# Patient Record
Sex: Female | Born: 1970 | Race: White | Hispanic: No | Marital: Married | Smoking: Never smoker
Health system: Southern US, Community
[De-identification: ages and names within clinical notes are randomized; demographics above are authoritative.]

## PROBLEM LIST (undated history)

## (undated) DIAGNOSIS — K219 Gastro-esophageal reflux disease without esophagitis: Secondary | ICD-10-CM

## (undated) HISTORY — DX: Gastro-esophageal reflux disease without esophagitis: K21.9

## (undated) HISTORY — PX: OTHER SURGICAL HISTORY: SHX169

---

## 2008-02-28 HISTORY — PX: HERNIA REPAIR: SHX51

## 2009-02-28 ENCOUNTER — Encounter: Payer: Self-pay | Admitting: Obstetrics & Gynecology

## 2009-02-28 ENCOUNTER — Ambulatory Visit: Payer: Self-pay | Admitting: Obstetrics & Gynecology

## 2009-09-29 HISTORY — PX: ENDOVENOUS ABLATION SAPHENOUS VEIN W/ LASER: SUR449

## 2010-01-07 ENCOUNTER — Ambulatory Visit: Payer: Self-pay | Admitting: Family Medicine

## 2010-01-07 DIAGNOSIS — K219 Gastro-esophageal reflux disease without esophagitis: Secondary | ICD-10-CM

## 2010-01-09 ENCOUNTER — Telehealth (INDEPENDENT_AMBULATORY_CARE_PROVIDER_SITE_OTHER): Payer: Self-pay | Admitting: *Deleted

## 2010-05-15 ENCOUNTER — Ambulatory Visit: Payer: Self-pay | Admitting: Obstetrics & Gynecology

## 2010-10-29 NOTE — Assessment & Plan Note (Signed)
Summary: NOV: GERD   Vital Signs:  Patient profile:   40 year old female Height:      67.2 inches Weight:      140 pounds BMI:     21.88 O2 Sat:      99 % on Room air Temp:     98.8 degrees F oral Pulse rate:   86 / minute BP sitting:   115 / 70  (left arm) Cuff size:   regular  Vitals Entered By: Kathlene November (January 07, 2010 1:28 PM)  O2 Flow:  Room air CC: NP_ Friday night sour stomach then burping then woke up with a cough the next day Is Patient Diabetic? No   CC:  NP_ Friday night sour stomach then burping then woke up with a cough the next day.  History of Present Illness: NP_ Friday night sour stomach then burping then woke up with a cough the next day.   Started IBU 600mg  for several weeks for deep vein injections for slcerotherapy. Then on March 18th started vitamin C and bioflavenoids for easy bruising.Then had left leg worked on in March and restarted the IBU.  Then Friday night felt like stomach was "sour" and had a mild ST.  Went downstiars adn drank a soda and started to b urp.  Then next AM when woke up had a severe pain in her uuper chest, throat and up towards her ears.  Then by Sat night the heaviness in her chest was severe and if bend over felt fluid come up into the back of her throat adn nose.  Next day didn't eat much.  Then yesterday started with a cough.  No URI or allergy sxs.  Chest feels very heavy today. NO diarrhea.  Tried Maalox yesterday.  Also tried husbands Zanctac.  Maalox did help. Nofever.   Habits & Providers  Alcohol-Tobacco-Diet     Alcohol drinks/day: <1     Tobacco Status: never  Exercise-Depression-Behavior     Does Patient Exercise: yes     Type of exercise: ellyptical, treadmill, running, weights     Exercise (avg: min/session): 30-60     Times/week: 3     Have you felt down or hopeless? no     STD Risk: never     Drug Use: no     Seat Belt Use: always  Current Medications (verified): 1)  Acai 500 Mg Caps (Acai) .... Take One  Tablet By Mouth Once Ad Ay 2)  Vitamin C 1000 Mg Tabs (Ascorbic Acid) .... Take One Tablet By Mouth Once A Day 3)  Ibuprofen 600 Mg Tabs (Ibuprofen) .... Take As Needed As Needed For Pain  Allergies (verified): 1)  ! Pcn  Comments:  Nurse/Medical Assistant: The patient's medications and allergies were reviewed with the patient and were updated in the Medication and Allergy Lists. Kathlene November (January 07, 2010 1:32 PM)  Past History:  Past Surgical History: Right leg endovenous laser ablation `09/2009 Left leg ELA 11/2009 Mass removed from right side, umbilical hernia repair 02/2008  Family History: Father and mother with DM, HTN  Social History: Preschool Asst Runner, broadcasting/film/video at [employer]  BS in Accounting from [employer]. Married to [place] with 2 kids, Romeo Apple and [employer] and her Engineer, agricultural.  Never Smoked Alcohol use-yes Drug use-no Regular exercise-yes 3 caffeinated drinks per day.  Smoking Status:  never Does Patient Exercise:  yes STD Risk:  never Drug Use:  no Seat Belt Use:  always  Review of Systems  No fever/sweats/weakness, unexplained weight loss/gain.  No vison changes.  No difficulty hearing/ringing in ears, hay fever/allergies.  No chest pain/discomfort, palpitations.  No Br lump/nipple discharge.  + cough/wheeze.  No blood in BM, nausea/vomiting/diarrhea.  No nighttime urination, leaking urine, unusual vaginal bleeding, discharge (penis or vagina).  No muscle/joint pain. No rash, change in mole.  No HA, memory loss.  No anxiety, sleep d/o, depression.  No easy bruising/bleeding, unexplained lump   Physical Exam  General:  Well-developed,well-nourished,in no acute distress; alert,appropriate and cooperative throughout examination Head:  Normocephalic and atraumatic without obvious abnormalities. No apparent alopecia or balding. Eyes:  No corneal or conjunctival inflammation noted. EOMI. Perrla. Ears:  External ear exam shows no significant lesions or deformities.   Otoscopic examination reveals clear canals, tympanic membranes are intact bilaterally without bulging, retraction, inflammation or discharge. Hearing is grossly normal bilaterally. Mouth:  Oral mucosa and oropharynx without lesions or exudates.  Teeth in good repair. Neck:  No deformities, masses, or tenderness noted. Lungs:  Normal respiratory effort, chest expands symmetrically. Lungs are clear to auscultation, no crackles or wheezes. Heart:  Normal rate and regular rhythm. S1 and S2 normal without gallop, murmur, click, rub or other extra sounds. Skin:  no rashes.   Cervical Nodes:  No lymphadenopathy noted Psych:  Cognition and judgment appear intact. Alert and cooperative with normal attention span and concentration. No apparent delusions, illusions, hallucinations   Impression & Recommendations:  Problem # 1:  GERD (ICD-530.81) Discussed options. For now recommend starting a PPI in addition to avoid measures. Discussed reflux measures. Samples of Protonix 40mg  once a day for 10 days. Then can switch to formularly covered PPI or Prilosec or prevacid both of which are over the countere.  Please let me know if not better in 5 days or if getting worrse or SOB. Chest pain is from the GERD and does not sounds cardiac. Also recommend swithc to Tyelnol for pain releif with her legs instead of using NSAIDs and reviewed all the OTC NSAIDs to avoid.  Also need to avoid acidic juices and the peppermint altoids she has been eating.    Complete Medication List: 1)  Acai 500 Mg Caps (Acai) .... Take one tablet by mouth once ad ay 2)  Vitamin C 1000 Mg Tabs (Ascorbic acid) .... Take one tablet by mouth once a day 3)  Ibuprofen 600 Mg Tabs (Ibuprofen) .... Take as needed as needed for pain  Patient Instructions: 1)  Call if not better in 5 days.   2)  If samples are helping then when run out you can call me and we call something into your drug store.  Or can try prilosec or prevacid , both of which are  over the counter. 3)  Call if gets shortness of breath or fever.  4)  Avoid anything greasy, spicey, acidic, or caffeine.   PAP Result Date:  02/27/2009 PAP Result:  normal

## 2010-10-29 NOTE — Progress Notes (Signed)
Summary: ? SE from Protonix  Phone Note Call from Patient   Caller: Patient Summary of Call: Pt states since she started Protonix her feet have been very itchy. Pt wanted to know if this was an allergy to the med?  Please advise. Initial call taken by: Payton Spark CMA,  January 09, 2010 2:39 PM  Follow-up for Phone Call        unlikely due to the medicine as an allergy would cause diffuse itching.  Follow-up by: Seymour Bars DO,  January 09, 2010 2:48 PM  Additional Follow-up for Phone Call Additional follow up Details #1::        Pt aware Additional Follow-up by: Payton Spark CMA,  January 09, 2010 3:01 PM

## 2010-11-06 ENCOUNTER — Telehealth: Payer: Self-pay | Admitting: Family Medicine

## 2010-11-14 NOTE — Progress Notes (Signed)
Summary: Rx for cold sore  Phone Note Call from Patient   Caller: Patient Summary of Call: Pt states she tried to get an apt for today but was unable. She needs Rx for cold sore. She has used pills and cream in the past. Please advise.  Initial call taken by: Payton Spark CMA,  November 06, 2010 3:43 PM    New/Updated Medications: VALACYCLOVIR HCL 1 GM TABS (VALACYCLOVIR HCL) 2 tabs by mouth two times a day x  1 day Prescriptions: VALACYCLOVIR HCL 1 GM TABS (VALACYCLOVIR HCL) 2 tabs by mouth two times a day x  1 day  #4 tabs x 1   Entered and Authorized by:   Seymour Bars DO   Signed by:   Seymour Bars DO on 11/06/2010   Method used:   [facility] [phone]* (retail)       [address]       [place], [place]  [address]       Ph: [phone] or [phone]       Fax: [phone]   RxID:   [id number]   Appended Document: Rx for cold sore LMOM informing Pt of the above

## 2010-11-29 ENCOUNTER — Ambulatory Visit (INDEPENDENT_AMBULATORY_CARE_PROVIDER_SITE_OTHER): Payer: BC Managed Care – PPO | Admitting: Family Medicine

## 2010-11-29 ENCOUNTER — Encounter: Payer: Self-pay | Admitting: Family Medicine

## 2010-11-29 DIAGNOSIS — B009 Herpesviral infection, unspecified: Secondary | ICD-10-CM

## 2010-11-29 DIAGNOSIS — L708 Other acne: Secondary | ICD-10-CM

## 2010-12-02 ENCOUNTER — Telehealth: Payer: Self-pay | Admitting: Family Medicine

## 2010-12-05 NOTE — Assessment & Plan Note (Signed)
Summary: Cold sore, acne on chin   Vital Signs:  Patient profile:   40 year old female Height:      67.2 inches (170.69 cm) Weight:      141.50 pounds (64.32 kg) BMI:     22.11 O2 Sat:      99 % on Room air Temp:     98.3 degrees F (36.83 degrees C) oral Pulse rate:   83 / minute BP sitting:   128 / 75  (right arm) Cuff size:   regular  Vitals Entered By: Lucious Groves CMA (November 29, 2010 2:49 PM)  O2 Flow:  Room air CC: Cold sore on bottom lip that began today./kb Comments Patient notes that she finished the Valacyclovir previously given.   CC:  Cold sore on bottom lip that began today./kb.  History of Present Illness: Cold sore on bottom lip that began today./kb Used to take valtrex but it was expensive. Also use to use zovirax cream but it expired in 2007.    Also having acne only on her chin for a few months Has beem working wiht an esthetician but not really improved. Starte proacitive as well.   Current Medications (verified): 1)  Valacyclovir Hcl 1 Gm Tabs (Valacyclovir Hcl) .... 2 Tabs By Mouth Two Times A Day X  1 Day  Allergies (verified): 1)  ! Pcn  Physical Exam  Skin:  Large fever blister in he center lower lip. Also has papular acne on her chin only.    Impression & Recommendations:  Problem # 1:  COLD SORE (ICD-054.9) Discussed tx will try a chearp regimen.   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 discusse avoiding touching her chin or rubbing it.  Will start clinda toipical cream and see if improved over the next few weeks. If not coniser course of oral steroids.   Complete Medication List: 1)  Famciclovir 500 Mg Tabs (Famciclovir) .... 3 tab by mouth x 1 2)  Clindamycin Phosphate 2 % Crea (Clindamycin phosphate) .... Apply at bedtime. 3)  Zovirax 5 % Oint (Acyclovir) .... Apply 6 x a day for 7 days  Patient Instructions: 1)  Call if skin on chin is not better in 2 week.  Prescriptions: ZOVIRAX 5 % OINT (ACYCLOVIR) Apply 6 x a  day for 7 days  #1 tube x 2   Entered and Authorized by:   Nani Gasser MD   Signed by:   Nani Gasser MD on 11/29/2010   Method used:   [facility]        [facility] [phone][facility] (retail)       [address]       [place], [place]  [address]       Ph: [phone] or [phone]       Fax: [phone]   RxID:   [id number] CLINDAMYCIN PHOSPHATE 2 % CREA (CLINDAMYCIN PHOSPHATE) Apply at bedtime.  #1 tube x 0   Entered and Authorized by:   Nani Gasser MD   Signed by:   Nani Gasser MD on 11/29/2010   Method used:   [facility]        [facility] [phone]* (retail)       [address]       [place], [place]  [address]       Ph: [phone] or [phone]       Fax: [phone]   RxID:   [id number] FAMCICLOVIR 500 MG  TABS (FAMCICLOVIR) 3 tab by mouth x 1  #9 x 2   Entered and Authorized by:   Nani Gasser MD   Signed by:   Nani Gasser MD on 11/29/2010   Method used:   [facility]        [facility] [phone]* (retail)       [address]       [place], [place]  [address]       Ph: [phone] or [phone]       Fax: [phone]   RxID:   [id number]    Orders Added: 1)  Est. Patient Level III [[address]]

## 2010-12-10 NOTE — Progress Notes (Signed)
Summary: Clindamycin Rx  Phone Note From Pharmacy Call back at [phone]   [facility]  [address] [phone]* Summary of Call: The 2% Clindamycin is a vaginal cream, pt needs to 1% if it is to be used as a facial cream, pls update Rx  Initial call taken by: Lannette Donath,  December 02, 2010 3:54 PM  Follow-up for Phone Call        Sorry, new rx sent.  Follow-up by: Nani Gasser MD,  December 02, 2010 4:44 PM    New/Updated Medications: CLINDAMYCIN PHOSPHATE 1 % LOTN (CLINDAMYCIN PHOSPHATE) Apply to affected area once a day. Prescriptions: CLINDAMYCIN PHOSPHATE 1 % LOTN (CLINDAMYCIN PHOSPHATE) Apply to affected area once a day.  #1 tube x 0   Entered and Authorized by:   Nani Gasser MD   Signed by:   Nani Gasser MD on 12/02/2010   Method used:   [facility]ronically to        [facility] [phone]* (retail)       [address]       [place], [place]  [address]       Ph: [phone] or [phone]       Fax: [phone]   RxID:   [id number]

## 2011-02-11 NOTE — Assessment & Plan Note (Signed)
NAMEASA, FATH                   ACCOUNT NO.:  [id number]   MEDICAL RECORD NO.:  [id number]          PATIENT TYPE:  POB   LOCATION:  [facility] at [place]         FACILITY:  [facility]   PHYSICIAN:  Allie Bossier, MD        DATE OF BIRTH:  1971-02-28   DATE OF SERVICE:  02/28/2009                                  CLINIC NOTE   Samone is a 40 year old married white, gravida 2, para 2.  She has 60 and  60 year old sons.  She comes here as a new patient exam.  She has no  particular GYN complaints.  She just needs an annual exam.   PAST MEDICAL HISTORY:  None.   PAST SURGICAL HISTORY:  She had umbilical hernia repair.  She had a  fibroma removal from her left abdominal wall.   REVIEW OF SYSTEMS:  She is married.  Her husband has had a vasectomy.  She works at a preschool.  Her last Pap smear was in 2008 and was  normal, and she did have a mammogram in 2005.   MEDICATIONS:  None.   ALLERGIES:  PENICILLIN which causes hives.  No latex allergies.   FAMILY HISTORY:  Negative for breast, GYN, and colon malignancies.   SOCIAL HISTORY:  She drinks socially.  Denies tobacco, alcohol, or drug  use.   Adding to her first chief complaint is even though she does not have any  GYN complaints, she does say that for the last 3-4 months, she has felt  a mass in her left breast that is nonpainful.  She denies nipple  discharges or skin changes.  She did have a normal mammogram in 2005  when she though she felt something else.   PHYSICAL EXAMINATION:  GENERAL:  A well-nourished, well-hydrated, very  pleasant white female.  VITAL SIGNS:  Height 6 feet 7.5, weight 141, blood pressure 112/74, and  pulse 70.  HEENT:  Normal.  HEART:  Regular rate and rhythm.  LUNGS:  Clear to auscultation bilaterally.  BREASTS:  To my feeling normal bilaterally.  She does have some  fibrocystic changes bilaterally in the upper outer quadrants on both  sides; however, as I have explained to her I will order diagnostic  mammogram of her left breast where she feels that it is different  tissue.  We will order routine mammogram on the right.  ABDOMEN:  Benign.  EXTERNAL GENITALIA:  Normal.  No lesions.  Cervix normal.  No lesions.  No cervical motion tenderness.  Bimanual exam shows her uterus to be  normal size and shape, anteverted mobile, nontender.  Adnexa are  nontender without masses.   ASSESSMENT/PLAN:  1. Annual exam.  I have checked Pap.  Recommend self-breast and self-      vulvar exams.  2. With a self-reported breast mass, although it is normal on my      physical exam.  I will order diagnostic mammogram on her left      breast.      Allie Bossier, MD     MCD/MEDQ  D:  02/28/2009  T:  03/01/2009  Job:  [id number]

## 2011-02-11 NOTE — Assessment & Plan Note (Signed)
NAMELAURALI, GODDARD                   ACCOUNT NO.:  [id number]   MEDICAL RECORD NO.:  [id number]          PATIENT TYPE:  POB   LOCATION:  [facility] [place]         FACILITY:  [facility]   PHYSICIAN:  Allie Bossier, MD        DATE OF BIRTH:  1971-03-06   DATE OF SERVICE:  05/15/2010                                  CLINIC NOTE   Ms. Leisey is a 40 year old married white G2, P2.  She has 76 and 11-year-  old sons and she comes in here for annual exam.  She has no GYN  complaints.   PAST MEDICAL HISTORY:  None.   PAST SURGICAL HISTORY:  She had a recent intravenous laser treatment of  both legs.  She has had a fibroma removal and an umbilical hernia  repair.   REVIEW OF SYSTEMS:  Her husband has had a vasectomy.  She denies  dyspareunia.  She works as a Manufacturing systems engineer.  She was [facility] year and was told that the small breast lump  that found was benign.  She had a mammogram at that time.  She had been  married for 13 years.  The remainder of review of systems questions are  negative.   No latex allergies.  Family history is negative for breast, GYN and  colon malignancies.   SOCIAL HISTORY:  She drinks alcohol on a social occasion, but denies  tobacco or illegal drug use.   MEDICATIONS:  She takes no medications.   PHYSICAL EXAMINATION:  GENERAL:  Well-nourished, well-hydrated white  female in no apparent distress.  VITAL SIGNS:  Height is 5 feet 7-1/2 inches, weight 139, blood pressure  108/69, pulse 74.  HEENT:  Normal.  BREASTS:  Normal bilaterally.  HEART:  Regular rate and rhythm.  LUNGS:  Clear to auscultation bilaterally.  ABDOMEN:  Benign.  No palpable hepatosplenomegaly.  PELVIC:  External genitalia, there is a U-shaped kissing lesion just  beneath her clitoris between the top of the 2 labia minora, feel this is  benign in nature but I am unable to tell if it is lichen sclerosus or  not.  Her cervix is with normal discharge and no cervical  motion  tenderness.  There are no lesions.  Bimanual exam revealed normal size  and shape, anteverted, mobile uterus.  Nonenlarged adnexa.   ASSESSMENT AND PLAN:  1. Annual exam.  I have checked Pap smear, recommended self-breast and      self-vulvar exams.  2. White lesion just beneath the clitoris.  I am recommending that she      come back next week and I will biopsy it.  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      Allie Bossier, MD     MCD/MEDQ  D:  05/15/2010  T:  05/15/2010  Job:  [id number]

## 2011-02-21 ENCOUNTER — Other Ambulatory Visit: Payer: Self-pay | Admitting: Family Medicine

## 2011-02-21 DIAGNOSIS — B009 Herpesviral infection, unspecified: Secondary | ICD-10-CM

## 2011-02-21 MED ORDER — FAMCICLOVIR 500 MG PO TABS: ORAL_TABLET | ORAL | Status: DC

## 2011-02-21 NOTE — Telephone Encounter (Signed)
Yes Ok for below.

## 2011-02-21 NOTE — Telephone Encounter (Signed)
Famciclovir 500 mg (Take 1500 mg PO X 1 dose) # 9/2 refills sent to pt [facility].  Pt informed. Jarvis Newcomer, LPN Domingo Dimes

## 2011-02-21 NOTE — Telephone Encounter (Signed)
Pt called and needs a refill of the cold sore medication(famcyclovir 500 mg PO)  Last refill was given on 11-29-10.  Is it okay to give the same as last time #9/2 refills?  Please advise. Plan:  ROUTED to Dr. Marlyne Beards, LPN [facility] Dimes

## 2011-05-08 ENCOUNTER — Encounter: Payer: Self-pay | Admitting: Family Medicine

## 2011-05-08 ENCOUNTER — Inpatient Hospital Stay (INDEPENDENT_AMBULATORY_CARE_PROVIDER_SITE_OTHER)
Admission: RE | Admit: 2011-05-08 | Discharge: 2011-05-08 | Disposition: A | Discharge status: Home / Self Care | Payer: BC Managed Care – PPO | Source: Ambulatory Visit | Attending: Family Medicine | Admitting: Family Medicine

## 2011-05-08 DIAGNOSIS — D239 Other benign neoplasm of skin, unspecified: Secondary | ICD-10-CM | POA: Insufficient documentation

## 2011-05-08 DIAGNOSIS — R079 Chest pain, unspecified: Secondary | ICD-10-CM

## 2011-05-08 DIAGNOSIS — K219 Gastro-esophageal reflux disease without esophagitis: Secondary | ICD-10-CM

## 2011-05-10 ENCOUNTER — Telehealth (INDEPENDENT_AMBULATORY_CARE_PROVIDER_SITE_OTHER): Payer: Self-pay

## 2011-05-27 ENCOUNTER — Ambulatory Visit: Payer: BC Managed Care – PPO | Admitting: Obstetrics & Gynecology

## 2011-06-04 ENCOUNTER — Telehealth: Payer: Self-pay

## 2011-06-04 ENCOUNTER — Telehealth: Payer: Self-pay | Admitting: Family Medicine

## 2011-06-04 ENCOUNTER — Ambulatory Visit (INDEPENDENT_AMBULATORY_CARE_PROVIDER_SITE_OTHER): Payer: BC Managed Care – PPO | Admitting: Family Medicine

## 2011-06-04 VITALS — BP 108/73 | HR 86

## 2011-06-04 DIAGNOSIS — N760 Acute vaginitis: Secondary | ICD-10-CM

## 2011-06-04 LAB — WET PREP FOR TRICH, YEAST, CLUE
Trich, Wet Prep: NONE SEEN
Yeast Wet Prep HPF POC: NONE SEEN

## 2011-06-04 MED ORDER — FLUCONAZOLE 150 MG PO TABS: 150.0000 mg | ORAL_TABLET | Freq: Once | ORAL | Status: AC

## 2011-06-04 NOTE — Telephone Encounter (Signed)
Pt aware of the above  

## 2011-06-04 NOTE — Telephone Encounter (Signed)
Spoke with pt, she is agreeable to Diflucan, is scheduled for annual exam in October.

## 2011-06-04 NOTE — Telephone Encounter (Signed)
Call pt: Wet prep was negative.

## 2011-06-04 NOTE — Telephone Encounter (Signed)
Per Dr Penne Lash, okay to treat with Diflucan 150mg  x 1 dose.  Pt due for annual exam

## 2011-06-04 NOTE — Progress Notes (Signed)
  Subjective:    Patient ID: Breanna Flynn, female    DOB: 1970-10-12, 40 y.o.   MRN: [id number]  HPI  Itching and discharge since this past Sat. Tried Monistat Mon didn't help  Review of Systems     Objective:   Physical Exam        Assessment & Plan:  Will send wet prep for further evaluation.

## 2011-06-27 ENCOUNTER — Ambulatory Visit (INDEPENDENT_AMBULATORY_CARE_PROVIDER_SITE_OTHER): Payer: BC Managed Care – PPO | Admitting: Family Medicine

## 2011-06-27 ENCOUNTER — Encounter: Payer: Self-pay | Admitting: Family Medicine

## 2011-06-27 VITALS — BP 117/72 | HR 85 | Ht 66.5 in | Wt 141.0 lb

## 2011-06-27 DIAGNOSIS — N63 Unspecified lump in unspecified breast: Secondary | ICD-10-CM

## 2011-06-27 NOTE — Patient Instructions (Signed)
We will call you with the referral. 

## 2011-06-27 NOTE — Progress Notes (Signed)
  Subjective:    Patient ID: Breanna Flynn, female    DOB: [birth year], 40 y.o.   MRN: [id number]  HPI  Left breast for about 2 weeks. Thought initally had worked out too much or worn a sports bra.  Dull constant ache.  When wakes up in th emiddle of the night will feel a sharp pain when sits up. Says she thinks that breast may be swollen as well.  No redness or rash.  She now feels a lump. No alleviating sxs. No worsening sxs.  Pain with palpations   Review of Systems     Objective:   Physical Exam  Left breast with a lima bean sized lump that is mobile at the 3 o'clock position about 1 cm from the nipple. No erythemam rash or induration. No skin chages.  Tender on exam.       Assessment & Plan:  Breast mass -Tired to reassure her mostly likely cyst but recommend further evaluation to make sure benign.  Refer for diagnositic mammo and [place]. Will check CBC today. Thought no sign of infection on exam today.   Declined flu vaccine.

## 2011-06-28 LAB — CBC WITH DIFFERENTIAL/PLATELET
Basophils Relative: 0 % (ref 0–1)
HCT: 38.4 % (ref 36.0–46.0)
Hemoglobin: 13 g/dL (ref 12.0–15.0)
Lymphocytes Relative: 30 % (ref 12–46)
Lymphs Abs: 1.9 10*3/uL (ref 0.7–4.0)
MCHC: 33.9 g/dL (ref 30.0–36.0)
Monocytes Absolute: 0.6 10*3/uL (ref 0.1–1.0)
Monocytes Relative: 9 % (ref 3–12)
Neutro Abs: 3.8 10*3/uL (ref 1.7–7.7)
Neutrophils Relative %: 59 % (ref 43–77)
RBC: 4.1 MIL/uL (ref 3.87–5.11)
WBC: 6.4 10*3/uL (ref 4.0–10.5)

## 2011-06-30 ENCOUNTER — Telehealth: Payer: Self-pay | Admitting: Family Medicine

## 2011-06-30 ENCOUNTER — Telehealth: Payer: Self-pay | Admitting: *Deleted

## 2011-06-30 NOTE — Telephone Encounter (Signed)
Left message on vm with results  

## 2011-06-30 NOTE — Telephone Encounter (Signed)
Pt called for her lab results. Plan:  [facility] for the pt at the number she left that her blood count was normal. Jarvis Newcomer, LPN [facility] Dimes

## 2011-06-30 NOTE — Telephone Encounter (Signed)
Message copied by Wyline Beady on Mon Jun 30, 2011  9:19 AM ------      Message from: Nani Gasser D      Created: Sat Jun 28, 2011  8:59 PM       Blood count was normal.

## 2011-07-08 ENCOUNTER — Encounter: Payer: Self-pay | Admitting: Family Medicine

## 2011-07-23 ENCOUNTER — Ambulatory Visit (INDEPENDENT_AMBULATORY_CARE_PROVIDER_SITE_OTHER): Payer: BC Managed Care – PPO | Admitting: Obstetrics & Gynecology

## 2011-07-23 ENCOUNTER — Encounter: Payer: Self-pay | Admitting: Obstetrics & Gynecology

## 2011-07-23 DIAGNOSIS — B009 Herpesviral infection, unspecified: Secondary | ICD-10-CM

## 2011-07-23 DIAGNOSIS — Z01419 Encounter for gynecological examination (general) (routine) without abnormal findings: Secondary | ICD-10-CM

## 2011-07-23 DIAGNOSIS — Z1272 Encounter for screening for malignant neoplasm of vagina: Secondary | ICD-10-CM

## 2011-07-23 DIAGNOSIS — Z113 Encounter for screening for infections with a predominantly sexual mode of transmission: Secondary | ICD-10-CM

## 2011-07-23 DIAGNOSIS — B373 Candidiasis of vulva and vagina: Secondary | ICD-10-CM

## 2011-07-23 DIAGNOSIS — B001 Herpesviral vesicular dermatitis: Secondary | ICD-10-CM

## 2011-07-23 MED ORDER — ACYCLOVIR 200 MG PO CAPS: 200.0000 mg | ORAL_CAPSULE | Freq: Three times a day (TID) | ORAL | Status: AC

## 2011-07-23 MED ORDER — FLUCONAZOLE 150 MG PO TABS: 150.0000 mg | ORAL_TABLET | Freq: Once | ORAL | Status: AC

## 2011-07-23 NOTE — Patient Instructions (Signed)
Candidal Vulvovaginitis Candidal vulvovaginitis is an infection of the vagina and vulva. The vulva is the skin around the opening of the vagina. This may cause itching and discomfort in and around the vagina.  HOME CARE  Only take medicine as told by your doctor.   Do not have sex (intercourse) until the infection is healed or as told by your doctor.   Practice safe sex.   Tell your sex partner about your infection.   Do not douche or use tampons.   Wear cotton underwear. Do not wear tight pants or panty hose.   Eat yogurt. This may help treat and prevent yeast infections.  GET HELP RIGHT AWAY IF:   You have a fever.   Your problems get worse during treatment or do not get better in 3 days.   You have discomfort, irritation, or itching in your vagina or vulva area.   You have pain after sex.   You start to get belly (abdominal) pain.  MAKE SURE YOU:  Understand these instructions.   Will watch your condition.   Will get help right away if you are not doing well or get worse.  Document Released: 12/12/2008 Document Revised: 05/28/2011 Document Reviewed: 12/12/2008 Exit[employer] Patient Information 2012 [employer]. 

## 2011-07-23 NOTE — Progress Notes (Signed)
Subjective:    Patient ID: Breanna Flynn, female    DOB: Apr 22, 1971, 40 y.o.   MRN: [id number]  HPI    Review of Systems     Objective:   Physical Exam        Assessment & Plan:   Subjective:    Breanna Flynn is a 40 y.o. G2 P2  female who presents for an annual exam. The patient has no complaints today. The patient is sexually active. GYN screening history: last pap: was normal. The patient wears seatbelts: yes. The patient participates in regular exercise: yes. Has the patient ever been transfused or tattooed?: no. The patient reports that there is not domestic violence in her life.   Menstrual History: OB History    Grav Para Term Preterm Abortions TAB SAB Ect Mult Living                  Menarche age: 40 Patient's last menstrual period was 07/09/2011.    The following portions of the patient's history were reviewed and updated as appropriate: allergies, current medications, past family history, past medical history, past social history, past surgical history and problem list.  Review of Systems A comprehensive review of systems was negative.  She has been married for 14 years and declines STI testing.   Objective:    BP 104/69  Pulse 78  Temp(Src) 98.6 F (37 C) (Oral)  Resp 16  Ht 5\' 7"  (1.702 m)  Wt 139 lb (63.05 kg)  BMI 21.77 kg/m2  LMP 07/09/2011  General Appearance:    Alert, cooperative, no distress, appears stated age  Head:    Normocephalic, without obvious abnormality, atraumatic  Eyes:    PERRL, conjunctiva/corneas clear, EOM's intact, fundi    benign, both eyes  Ears:    Normal TM's and external ear canals, both ears  Nose:   Nares normal, septum midline, mucosa normal, no drainage    or sinus tenderness  Throat:   Lips, mucosa, and tongue normal; teeth and gums normal  Neck:   Supple, symmetrical, trachea midline, no adenopathy;    thyroid:  no enlargement/tenderness/nodules; no carotid   bruit or JVD  Back:     Symmetric, no curvature, ROM  normal, no CVA tenderness  Lungs:     Clear to auscultation bilaterally, respirations unlabored  Chest Wall:    No tenderness or deformity   Heart:    Regular rate and rhythm, S1 and S2 normal, no murmur, rub   or gallop  Breast Exam:    No tenderness, masses, or nipple abnormality, I only feel fibrocystic changes BL, no discrete masses  Abdomen:     Soft, non-tender, bowel sounds active all four quadrants,    no masses, no organomegaly  Genitalia:    Normal female without lesion, discharge or tenderness, uterus NSSRV, NT, no masses     Extremities:   Extremities normal, atraumatic, no cyanosis or edema  Pulses:   2+ and symmetric all extremities  Skin:   Skin color, texture, turgor normal, no rashes or lesions  Lymph nodes:   Cervical, supraclavicular, and axillary nodes normal  Neurologic:   CNII-XII intact, normal strength, sensation and reflexes    throughout  .    Assessment:    Healthy female exam.    Plan:   Rec SBE/SVE Check pap smear She will call if she wants a second opinion with a general surgeon regarding her left breast Change valtrex to acyclovir due to her reaction of  headaches with Valtrex  All questions answered.

## 2011-07-23 NOTE — Progress Notes (Signed)
Addended by: Granville Lewis on: 07/23/2011 10:02 AM   Modules accepted: Orders

## 2011-09-01 NOTE — Progress Notes (Signed)
Summary: ?GERD/REFLUX? (room 5)   Vital Signs:  Patient Profile:   40 Years Old Female CC:      pain in upper GI Height:     66 inches (170.69 cm) Weight:      141 pounds O2 Sat:      99 % O2 treatment:    Room Air Temp:     98.5 degrees F oral Pulse rate:   83 / minute Resp:     16 per minute BP sitting:   112 / 72  (left arm) Cuff size:   regular                  Prior Medication List:  FAMCICLOVIR 500 MG TABS (FAMCICLOVIR) 3 tab by mouth x 1 CLINDAMYCIN PHOSPHATE 1 % LOTN (CLINDAMYCIN PHOSPHATE) Apply to affected area once a day. ZOVIRAX 5 % OINT (ACYCLOVIR) Apply 6 x a day for 7 days   Current Allergies (reviewed today): ! PCNHistory of Present Illness Chief Complaint: pain in upper GI History of Present Illness: Patient w/ reflux before about a year ago after taking high dose of motrin after venous stripping. She took the protonix sampls for a while and stoped after she got better. She started having pain and symptoms of reflux about 4 days ago. Her knights have been miserable as she has had pain in her espogus and chest through out the night . She found a pack of protonix and has taken 2 dosages but does not feeel as if they have done much to help.   Current Problems: BENIGN NEOPLASM OF OTHER SPECIFIED SITES OF SKIN (ICD-216.8) CHEST PAIN, ACUTE (ICD-786.50) GERD (ICD-530.81) ACNE, CYSTIC (ICD-706.1) COLD SORE (ICD-054.9) GERD (ICD-530.81)   Current Meds * PROTONIX 40 MG as needed * TUMS/ MYLANTA as needed ZEGERID 40-1100 MG CAPS (OMEPRAZOLE-SODIUM BICARBONATE) 1 by mouth daily if notcovered then may use Protonix 40,prevacid 30,nexium 40, acipex 20 # 30 1 by mouth q day CARAFATE 1 GM TABS (SUCRALFATE) 2 tablet twice aday 1 hr before eating  REVIEW OF SYSTEMS Constitutional Symptoms      Denies fever, chills, night sweats, weight loss, weight gain, and fatigue.  Eyes       Denies change in vision, eye pain, eye discharge, glasses, contact lenses, and eye  surgery. Ear/Nose/Throat/Mouth       Complains of sinus problems.      Denies hearing loss/aids, change in hearing, ear pain, ear discharge, dizziness, frequent runny nose, frequent nose bleeds, sore throat, hoarseness, and tooth pain or bleeding.  Respiratory       Denies dry cough, productive cough, wheezing, shortness of breath, asthma, bronchitis, and emphysema/COPD.  Cardiovascular       Complains of chest pain.      Denies murmurs and tires easily with exhertion.    Gastrointestinal       Complains of stomach pain and indigestion.      Denies nausea/vomiting, diarrhea, constipation, and blood in bowel movements.      Comments: upper GI pain Genitourniary       Denies painful urination, kidney stones, and loss of urinary control. Neurological       Denies paralysis, seizures, and fainting/blackouts. Musculoskeletal       Denies muscle pain, joint pain, joint stiffness, decreased range of motion, redness, swelling, muscle weakness, and gout.  Skin       Denies bruising, unusual mles/lumps or sores, and hair/skin or nail changes.  Psych       Denies mood  changes, temper/anger issues, anxiety/stress, speech problems, depression, and sleep problems. 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  BS in Accounting from [employer]. Married to [place] with 2 kids, Romeo Apple and [employer] and her Engineer, agricultural.  Never Smoked Alcohol use-yes Drug use-no Regular exercise-yes 3 caffeinated drinks per day.   Risk Factors: Alcohol Use: <1 (01/07/2010) Exercise: yes (01/07/2010)  Risk Factors: Smoking Status: never (01/07/2010)  Past Medical History: acid reflux (following ibuprofen tx x 2 months)  Past Surgical History: Reviewed history from 01/07/2010 and no  changes required. Right leg endovenous laser ablation `09/2009 Left leg ELA 11/2009 Mass removed from right side, umbilical hernia repair 02/2008  Family History: Reviewed history from 01/07/2010 and no changes required. Father and mother with DM, HTN Family History High cholesterol  Social History: Reviewed history from 01/07/2010 and no changes required. 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 Married to [place] with 2 kids, Romeo Apple and [employer] and her Engineer, agricultural.  Never Smoked Alcohol use-yes Drug use-no Regular exercise-yes 3 caffeinated drinks per day.  Physical Exam General appearance: well developed, well nourished, no acute distress Head: normocephalic, atraumatic Oral/Pharynx: tongue normal, posterior pharynx without erythema or exudate Neck: neck supple,  trachea midline, no masses Chest/Lungs: no rales, wheezes, or rhonchi bilateral, breath sounds equal without effort Heart: regular rate and  rhythm, no murmur Abdomen: soft, non-tender without obvious organomegaly Back: no tenderness over musculature, straight leg raises negative bilaterally, deep tendon reflexes 2+ at achilles and patella Skin: no obvious rashes  a skin nodule/mole present on R thigh of the back MSE: oriented to time, place, and person Assessment Problems:   ACNE, CYSTIC (ICD-706.1) COLD SORE (ICD-054.9) GERD (ICD-530.81) New Problems: BENIGN NEOPLASM OF OTHER SPECIFIED SITES OF SKIN (ICD-216.8) CHEST PAIN, ACUTE (ICD-786.50) GERD (ICD-530.81)  unlilely cardiac   Patient Education: Patient and/or caregiver instructed in the following: rest fluids and Tylenol.  Plan New Medications/Changes: CARAFATE 1 GM TABS (SUCRALFATE) 2 tablet twice aday 1 hr before eating  #120 x 0, 05/08/2011, Hassan Rowan MD ZEGERID 40-1100 MG CAPS (OMEPRAZOLE-SODIUM BICARBONATE) 1 by mouth daily if notcovered then may use Protonix 40,prevacid 30,nexium 40, acipex 20 # 30 1 by mouth q day   #30 x 1, 05/08/2011, Hassan Rowan MD  New Orders: New Patient Level IV [phone] EKG w/ Interpretation [93000] Planning Comments:   will get EKG   The patient and/or caregiver has been counseled thoroughly with regard to medications prescribed including dosage, schedule, interactions, rationale for use, and possible side effects and they verbalize understanding.  Diagnoses and expected course of recovery discussed and will return if not improved as expected or if the condition worsens. Patient and/or caregiver verbalized understanding.   PROCEDURE: Follow up: Patient skin lesion not worrisome in apperence Prescriptions: CARAFATE 1 GM TABS (SUCRALFATE) 2 tablet twice aday 1 hr before eating  #120 x 0   Entered and Authorized by:   Hassan Rowan MD   Signed by:   Hassan Rowan MD on 05/08/2011   Method used:   Printed then faxed to ...       [facility]  [address] [phone]* (retail)       [address]       [place], [place]  [address]  Ph: [phone] or [phone]       Fax: [phone]   RxID:   [id number] ZEGERID 40-1100 MG CAPS (OMEPRAZOLE-SODIUM BICARBONATE) 1 by mouth daily if notcovered then may use Protonix 40,prevacid 30,nexium 40, acipex 20 # 30 1 by mouth q day  #30 x 1   Entered and Authorized by:   Hassan Rowan MD   Signed by:   Hassan Rowan MD on 05/08/2011   Method used:   Printed then faxed to ...       [facility]  [address] [phone]* (retail)       [address] [place], [place]  [address]       Ph: [phone] or [phone]       Fax: [phone]   RxID:   [id number]   Patient Instructions: 1)  Need to elevate head of the bed w/o pillows, avoid eating 4 hrs before going to bed at night,  2)  If medicattiondoes not helpm in 1-2 weeks will need folow up w/PCP for other testt like stress test and endoscopy 3)  May need H.Pylori test 4)  Please schedule a follow-up appointment as needed. 5)  Please schedule an appointment with your primary doctor in :1-3 weks  if not better 6)  for the mole/skin lesion please measure it and if it gets bigger please see PCP or return for possible biopsy  Orders Added: 1)  New Patient Level IV [99204] 2)  EKG w/ Interpretation [93000]

## 2011-09-01 NOTE — Telephone Encounter (Signed)
  Phone Note Outgoing Call   Call placed to: Patient Summary of Call: pt states she is still having some discomfort, especially after eating. Informed her of foods that are good to eat and to continue with medication and call to schedule GI appointment

## 2011-11-11 ENCOUNTER — Telehealth: Payer: Self-pay | Admitting: *Deleted

## 2011-11-11 DIAGNOSIS — Z1231 Encounter for screening mammogram for malignant neoplasm of breast: Secondary | ICD-10-CM

## 2011-11-11 NOTE — Telephone Encounter (Signed)
Pt requesting referral for mammogram

## 2011-11-11 NOTE — Telephone Encounter (Signed)
ordr placed

## 2011-12-19 ENCOUNTER — Ambulatory Visit (INDEPENDENT_AMBULATORY_CARE_PROVIDER_SITE_OTHER): Payer: BC Managed Care – PPO | Admitting: Physician Assistant

## 2011-12-19 ENCOUNTER — Encounter: Payer: Self-pay | Admitting: Physician Assistant

## 2011-12-19 VITALS — BP 116/76 | HR 75 | Temp 97.8°F | Ht 67.0 in | Wt 143.0 lb

## 2011-12-19 DIAGNOSIS — R0981 Nasal congestion: Secondary | ICD-10-CM

## 2011-12-19 DIAGNOSIS — N6320 Unspecified lump in the left breast, unspecified quadrant: Secondary | ICD-10-CM

## 2011-12-19 DIAGNOSIS — L0100 Impetigo, unspecified: Secondary | ICD-10-CM

## 2011-12-19 DIAGNOSIS — J3489 Other specified disorders of nose and nasal sinuses: Secondary | ICD-10-CM

## 2011-12-19 MED ORDER — MUPIROCIN 2 % EX OINT: TOPICAL_OINTMENT | Freq: Three times a day (TID) | CUTANEOUS | Status: AC

## 2011-12-19 MED ORDER — FLUTICASONE PROPIONATE 50 MCG/ACT NA SUSP: 2.0000 | Freq: Every day | NASAL | Status: DC

## 2011-12-19 NOTE — Patient Instructions (Addendum)
Given Flonase for nasal congestion. Bactroban to use on area three times a day. If not improved in 3-5 days then call office.   Impetigo Impetigo is an infection of the skin, most common in babies and children.  CAUSES  It is caused by staphylococcal or streptococcal germs (bacteria). Impetigo can start after any damage to the skin. The damage to the skin may be from things like:   Chickenpox.   Scrapes.   Scratches.   Insect bites (common when children scratch the bite).   Cuts.   Nail biting or chewing.  Impetigo is contagious. It can be spread from one person to another. Avoid close skin contact, or sharing towels or clothing. SYMPTOMS  Impetigo usually starts out as small blisters or pustules. Then they turn into tiny yellow-crusted sores (lesions).  There may also be:  Large blisters.   Itching or pain.   Pus.   Swollen lymph glands.  With scratching, irritation, or non-treatment, these small areas may get larger. Scratching can cause the germs to get under the fingernails; then scratching another part of the skin can cause the infection to be spread there. DIAGNOSIS  Diagnosis of impetigo is usually made by a physical exam. A skin culture (test to grow bacteria) may be done to prove the diagnosis or to help decide the best treatment.  TREATMENT  Mild impetigo can be treated with prescription antibiotic cream. Oral antibiotic medicine may be used in more severe cases. Medicines for itching may be used. HOME CARE INSTRUCTIONS   To avoid spreading impetigo to other body areas:   Keep fingernails short and clean.   Avoid scratching.   Cover infected areas if necessary to keep from scratching.   Gently wash the infected areas with antibiotic soap and water.   Soak crusted areas in warm soapy water using antibiotic soap.   Gently rub the areas to remove crusts. Do not scrub.   Wash hands often to avoid spread this infection.   Keep children with impetigo home from  school or daycare until they have used an antibiotic cream for 48 hours (2 days) or oral antibiotic medicine for 24 hours (1 day), and their skin shows significant improvement.   Children may attend school or daycare if they only have a few sores and if the sores can be covered by a bandage or clothing.  SEEK MEDICAL CARE IF:   More blisters or sores show up despite treatment.   Other family members get sores.   Rash is not improving after 48 hours (2 days) of treatment.  SEEK IMMEDIATE MEDICAL CARE IF:   You see spreading redness or swelling of the skin around the sores.   You see red streaks coming from the sores.   Your child develops a fever of 100.4 F (37.2 C) or higher.   Your child develops a sore throat.   Your child is acting ill (lethargic, sick to their stomach).  Document Released: 09/12/2000 Document Revised: 09/04/2011 Document Reviewed: 07/12/2008 [facility] Patient Information 2012 [place], [place].

## 2011-12-19 NOTE — Progress Notes (Signed)
  Subjective:    Patient ID: Breanna Flynn, female    DOB: [birth year], 41 y.o.   MRN: [id number]  HPI Patient present to clinic with tiny bumps on face for 2 weeks. She has a history of cold sores but she has zostivax that she takes for 3 days and it usually clears up cold sores. These bump have not gone away and started differently. She remembers that 2 weeks ago she had a cold symptoms and blew her nose so much it was like her nose was raw. It started as one bump and has spread to numerous red bumps and some with drainage. She has a new bump pop up today. She denies and burning sensation or pain. She has not had a fever, chills, n/v, muscle aches, sore throat or ear pain. She has had some nasal congestion that has been on and off for over 1 month.   She needs to get diagnostic mammogram for 6 month recheck on lump on left breast.    .   Review of Systems     Objective:   Physical Exam  Neck: Normal range of motion. Neck supple.  Skin:       Small cluster of blister like bumps on the right side of face on the cheek and in the crease of the right nares spanning an area of about the size of a dime. Some bumps were crusted and sat on a bed of erythema.          Assessment & Plan:  Impetigo- Gave Bactroban to apply 3 times a day for 7 days. If not improving in 3-5 days call office. Gave Handout on ways to limit transmission and spreading.   Nasal congestion-Flonase given to use as needed for congestion 2 sprays each nostril once daily.  Diagnositic mammogram ordered.   Patient is aware of need for a CPE and Tdap. She states she will schedule.

## 2011-12-23 ENCOUNTER — Other Ambulatory Visit: Payer: Self-pay | Admitting: Physician Assistant

## 2011-12-23 MED ORDER — DOXYCYCLINE HYCLATE 100 MG PO CAPS: 100.0000 mg | ORAL_CAPSULE | Freq: Two times a day (BID) | ORAL | Status: AC

## 2011-12-23 NOTE — Progress Notes (Signed)
Patient is allergic to PCN. Sent Doxy for 7 days. She reports that the lesion has not spread and has dried up some but has not gone away. No pain, burning, or itching. Instructed to use Bactroban in nostrils.

## 2011-12-24 ENCOUNTER — Telehealth: Payer: Self-pay | Admitting: *Deleted

## 2011-12-24 MED ORDER — SULFAMETHOXAZOLE-TMP DS 800-160 MG PO TABS: 1.0000 | ORAL_TABLET | Freq: Two times a day (BID) | ORAL | Status: AC

## 2011-12-24 NOTE — Telephone Encounter (Signed)
Let patient know I will send Bactrim to pharmacy. Inform her that tier levels are different for different insurance companies.

## 2011-12-24 NOTE — Telephone Encounter (Signed)
Pt informed

## 2011-12-24 NOTE — Telephone Encounter (Signed)
Pt is requesting an abx that is in a lower tier and that doesn't cause acid reflux. Pt is unsure of name of abx. She thinks it is Doxy. I don't see where that was sent ot pharmacy. Please advise.

## 2011-12-26 DIAGNOSIS — L720 Epidermal cyst: Secondary | ICD-10-CM | POA: Insufficient documentation

## 2011-12-26 DIAGNOSIS — L719 Rosacea, unspecified: Secondary | ICD-10-CM | POA: Insufficient documentation

## 2012-02-03 ENCOUNTER — Other Ambulatory Visit: Payer: Self-pay | Admitting: Obstetrics & Gynecology

## 2012-02-03 ENCOUNTER — Encounter: Payer: Self-pay | Admitting: Obstetrics & Gynecology

## 2012-02-03 ENCOUNTER — Ambulatory Visit (INDEPENDENT_AMBULATORY_CARE_PROVIDER_SITE_OTHER): Payer: BC Managed Care – PPO | Admitting: Obstetrics & Gynecology

## 2012-02-03 VITALS — BP 109/80 | HR 74 | Ht 66.5 in | Wt 141.0 lb

## 2012-02-03 DIAGNOSIS — N898 Other specified noninflammatory disorders of vagina: Secondary | ICD-10-CM

## 2012-02-03 DIAGNOSIS — L293 Anogenital pruritus, unspecified: Secondary | ICD-10-CM

## 2012-02-03 MED ORDER — FLUCONAZOLE 150 MG PO TABS: 150.0000 mg | ORAL_TABLET | Freq: Once | ORAL | Status: AC

## 2012-02-03 NOTE — Progress Notes (Signed)
  Subjective:    Patient ID: Breanna Flynn, female    DOB: [birth year], 41 y.o.   MRN: [id number]  HPI  Breanna Flynn is a pleasant young lady with intermittent vaginal itching that she attributes to "yeast" even though no yeast has ever been seen here on a wet prep. She has been prescribed oral diflucan on 2 occasions and says that the itching is relieved. She is going on a cruise next week and would like to have a diflucan with her in the event of a vaginal itch. She is currently being treated for facial shingles with Valtrex but she says it has not helped at all.   Review of Systems     Objective:   Physical Exam        Assessment & Plan:  Vaginal itch- I will check a HSV2 IgG for thoroughness sake. I have advised her to come to the office when she does have the vaginal itch so we can get a wet prep. I will give her a diflucan to take with her on her cruise.

## 2012-02-03 NOTE — Progress Notes (Signed)
Patient is here to discuss increased yeast infections as well as increased acne and cold sores.

## 2012-02-04 LAB — HSV 2 ANTIBODY, IGG: HSV 2 Glycoprotein G Ab, IgG: <0.1 IV

## 2012-02-16 ENCOUNTER — Telehealth: Payer: Self-pay | Admitting: Family Medicine

## 2012-02-16 NOTE — Telephone Encounter (Signed)
Call pt: Has she been contacted for her mammo? If not, then called [facility] and have them contact her.

## 2012-02-17 NOTE — Telephone Encounter (Signed)
Left message to call [facility] if have not heard from them

## 2012-03-02 ENCOUNTER — Telehealth: Payer: Self-pay | Admitting: *Deleted

## 2012-03-03 ENCOUNTER — Telehealth: Payer: Self-pay | Admitting: *Deleted

## 2012-03-03 NOTE — Telephone Encounter (Signed)
Pt called yesterday and states she did get messages concerning a mammogram at [facility]. She states in her vm that she forgot to call us back but she goes to the breast clinic in [place].Pt needs an order for a 6 mon f/u mammo.faxed order and made appt for pt yesterday at the breast clinic for next thurs June 12 at 200. Left message on pts vm

## 2012-03-09 ENCOUNTER — Encounter: Payer: Self-pay | Admitting: Obstetrics & Gynecology

## 2012-03-09 ENCOUNTER — Ambulatory Visit (INDEPENDENT_AMBULATORY_CARE_PROVIDER_SITE_OTHER): Payer: BC Managed Care – PPO | Admitting: Obstetrics & Gynecology

## 2012-03-09 VITALS — BP 127/69 | HR 85 | Temp 98.2°F | Resp 16 | Ht 67.0 in | Wt 140.0 lb

## 2012-03-09 DIAGNOSIS — N760 Acute vaginitis: Secondary | ICD-10-CM

## 2012-03-09 MED ORDER — FLUCONAZOLE 150 MG PO TABS: 150.0000 mg | ORAL_TABLET | Freq: Once | ORAL | Status: AC

## 2012-03-09 NOTE — Progress Notes (Signed)
  Subjective:    Patient ID: Breanna Flynn, female    DOB: 04-03-1971, 41 y.o.   MRN: [id number]  HPI  41 yo MW lady who is here for a 3 week h/o vaginal itching, not helped by 2 courses of Monistat recently or Vagisil wipes. She has been on doxy for 4 weeks for a perioral dermatitis. Of note, yesterday she had a "encounter" with her personal trainer.  Review of Systems     Objective:   Physical Exam  Generalized erythema of vulva, some white d/c in vagina      Assessment & Plan:  Vaginitis-difulcan now, check cervical cultures and wet prep today RTC for STI testing in 1 month. "encounter" - Plan B

## 2012-03-10 ENCOUNTER — Telehealth: Payer: Self-pay | Admitting: *Deleted

## 2012-03-10 DIAGNOSIS — B9689 Other specified bacterial agents as the cause of diseases classified elsewhere: Secondary | ICD-10-CM

## 2012-03-10 LAB — GC/CHLAMYDIA PROBE AMP, GENITAL: Chlamydia, DNA Probe: NEGATIVE

## 2012-03-10 MED ORDER — METRONIDAZOLE 500 MG PO TABS: 500.0000 mg | ORAL_TABLET | Freq: Two times a day (BID) | ORAL | Status: AC

## 2012-03-10 NOTE — Telephone Encounter (Signed)
Pt notified of BV on wet prep.  Flagyl 500mg  #14 BID sent to [facility]

## 2012-03-12 ENCOUNTER — Telehealth: Payer: Self-pay | Admitting: *Deleted

## 2012-03-12 DIAGNOSIS — B9689 Other specified bacterial agents as the cause of diseases classified elsewhere: Secondary | ICD-10-CM

## 2012-03-12 MED ORDER — METRONIDAZOLE 0.75 % VA GEL: 1.0000 | Freq: Every day | VAGINAL | Status: AC

## 2012-03-12 NOTE — Telephone Encounter (Signed)
Pt called stating that the metrodianazole PO upset her stomach so we switched to vaginal gel. This was sent to [facility].

## 2012-03-12 NOTE — Telephone Encounter (Signed)
Pt notified of neg test results from [facility]/Chlamydia

## 2012-03-24 ENCOUNTER — Encounter: Payer: Self-pay | Admitting: *Deleted

## 2012-06-17 ENCOUNTER — Ambulatory Visit (INDEPENDENT_AMBULATORY_CARE_PROVIDER_SITE_OTHER): Payer: BC Managed Care – PPO | Admitting: Family Medicine

## 2012-06-17 ENCOUNTER — Encounter: Payer: Self-pay | Admitting: Family Medicine

## 2012-06-17 VITALS — BP 120/78 | HR 78 | Wt 147.0 lb

## 2012-06-17 DIAGNOSIS — J40 Bronchitis, not specified as acute or chronic: Secondary | ICD-10-CM

## 2012-06-17 DIAGNOSIS — J329 Chronic sinusitis, unspecified: Secondary | ICD-10-CM

## 2012-06-17 MED ORDER — AZITHROMYCIN 250 MG PO TABS: ORAL_TABLET | ORAL | Status: DC

## 2012-06-17 NOTE — Progress Notes (Signed)
  Subjective:    Patient ID: Breanna Flynn, female    DOB: 09-Feb-1971, 41 y.o.   MRN: [id number]  HPI 8 days of nasal congestion and drainage. Was using sudafed.  Got rest over the weekend.  Then 5 days ago says she feels worse. FEver started 3 days ago and now moving into her chest.  + fever.  Now painful cough.  Cough is productive and has a bad taste in her mouth. Sputum is green and yello.  Mason her son had pneumonia, so she used 2 doses of azithromycin.  Has been using IBU to control her fever.     Review of Systems     Objective:   Physical Exam  Constitutional: She is oriented to person, place, and time. She appears well-developed and well-nourished.  HENT:  Head: Normocephalic and atraumatic.  Right Ear: External ear normal.  Left Ear: External ear normal.  Nose: Nose normal.  Mouth/Throat: Oropharynx is clear and moist.       TMs and canals are clear.  = white post nasal drip.   Eyes: Conjunctivae normal and EOM are normal. Pupils are equal, round, and reactive to light.  Neck: Neck supple. No thyromegaly present.  Cardiovascular: Normal rate, regular rhythm and normal heart sounds.   Pulmonary/Chest: Effort normal and breath sounds normal. She has no wheezes.  Lymphadenopathy:    She has no cervical adenopathy.  Neurological: She is alert and oriented to person, place, and time.  Skin: Skin is warm and dry.  Psychiatric: She has a normal mood and affect.          Assessment & Plan:  Sinusitis/bronchitis- will treat with azithromycin. The prescription that she used it was her son's is actually expired. If she's not significantly better in a problem Monday then please call the office. Otherwise continue symptomatic treatment with her Sudafed and ibuprofen. Make sure staying hydrated and drinking plan fluids.

## 2012-06-17 NOTE — Patient Instructions (Signed)
Sinusitis Sinuses are air pockets within the bones of your face. The growth of bacteria within a sinus leads to infection. The infection prevents the sinuses from draining. This infection is called sinusitis. SYMPTOMS   There will be different areas of pain depending on which sinuses have become infected.  The maxillary sinuses often produce pain beneath the eyes.   Frontal sinusitis may cause pain in the middle of the forehead and above the eyes.  Other problems (symptoms) include:  Toothaches.   Colored, pus-like (purulent) drainage from the nose.   Swelling, warmth, and tenderness over the sinus areas may be signs of infection.  TREATMENT   Sinusitis is most often determined by an exam.X-rays may be taken. If x-rays have been taken, make sure you obtain your results or find out how you are to obtain them. Your caregiver may give you medications (antibiotics). These are medications that will help kill the bacteria causing the infection. You may also be given a medication (decongestant) that helps to reduce sinus swelling.   HOME CARE INSTRUCTIONS    Only take over-the-counter or prescription medicines for pain, discomfort, or fever as directed by your caregiver.   Drink extra fluids. Fluids help thin the mucus so your sinuses can drain more easily.   Applying either moist heat or ice packs to the sinus areas may help relieve discomfort.   Use saline nasal sprays to help moisten your sinuses. The sprays can be found at your local drugstore.  SEEK IMMEDIATE MEDICAL CARE IF:  You have a fever.   You have increasing pain, severe headaches, or toothache.   You have nausea, vomiting, or drowsiness.   You develop unusual swelling around the face or trouble seeing.  MAKE SURE YOU:    Understand these instructions.   Will watch your condition.   Will get help right away if you are not doing well or get worse.  Document Released: 09/15/2005 Document Revised: 09/04/2011 Document  Reviewed: 04/14/2007 Exit[employer] Patient Information 2012 [employer].Acute Bronchitis You have acute bronchitis. This means you have a chest cold. The airways in your lungs are red and sore (inflamed). Acute means it is sudden onset.   CAUSES Bronchitis is most often caused by the same virus that causes a cold. SYMPTOMS    Body aches.   Chest congestion.   Chills.   Cough.   Fever.   Shortness of breath.   Sore throat.  TREATMENT   Acute bronchitis is usually treated with rest, fluids, and medicines for relief of fever or cough. Most symptoms should go away after a few days or a week. Increased fluids may help thin your secretions and will prevent dehydration. Your caregiver may give you an inhaler to improve your symptoms. The inhaler reduces shortness of breath and helps control cough. You can take over-the-counter pain relievers or cough medicine to decrease coughing, pain, or fever. A cool-air vaporizer may help thin bronchial secretions and make it easier to clear your chest. Antibiotics are usually not needed but can be prescribed if you smoke, are seriously ill, have chronic lung problems, are elderly, or you are at higher risk for developing complications. Allergies and asthma can make bronchitis worse. Repeated episodes of bronchitis may cause longstanding lung problems. Avoid smoking and secondhand smoke. Exposure to cigarette smoke or irritating chemicals will make bronchitis worse. If you are a cigarette smoker, consider using nicotine gum or skin patches to help control withdrawal symptoms. Quitting smoking will help your lungs heal faster. Recovery   from bronchitis is often slow, but you should start feeling better after 2 to 3 days. Cough from bronchitis frequently lasts for 3 to 4 weeks. To prevent another bout of acute bronchitis:  Quit smoking.   Wash your hands frequently to get rid of viruses or use a hand sanitizer.   Avoid other people with cold or virus  symptoms.   Try not to touch your hands to your mouth, nose, or eyes.  SEEK IMMEDIATE MEDICAL CARE IF:  You develop increased fever, chills, or chest pain.   You have severe shortness of breath or bloody sputum.   You develop dehydration, fainting, repeated vomiting, or a severe headache.   You have no improvement after 1 week of treatment or you get worse.  MAKE SURE YOU:    Understand these instructions.   Will watch your condition.   Will get help right away if you are not doing well or get worse.  Document Released: 10/23/2004 Document Revised: 09/04/2011 Document Reviewed: 01/08/2011 Exit[employer] Patient Information 2012 [employer]. 

## 2012-08-03 ENCOUNTER — Ambulatory Visit (INDEPENDENT_AMBULATORY_CARE_PROVIDER_SITE_OTHER): Payer: BC Managed Care – PPO | Admitting: Obstetrics & Gynecology

## 2012-08-03 ENCOUNTER — Encounter: Payer: Self-pay | Admitting: Obstetrics & Gynecology

## 2012-08-03 VITALS — BP 117/70 | HR 77 | Temp 96.7°F | Resp 16 | Ht 66.0 in | Wt 147.0 lb

## 2012-08-03 DIAGNOSIS — Z01419 Encounter for gynecological examination (general) (routine) without abnormal findings: Secondary | ICD-10-CM

## 2012-08-03 DIAGNOSIS — Z124 Encounter for screening for malignant neoplasm of cervix: Secondary | ICD-10-CM

## 2012-08-03 DIAGNOSIS — Z1151 Encounter for screening for human papillomavirus (HPV): Secondary | ICD-10-CM

## 2012-08-03 DIAGNOSIS — Z0000 Encounter for general adult medical examination without abnormal findings: Secondary | ICD-10-CM

## 2012-08-03 DIAGNOSIS — Z113 Encounter for screening for infections with a predominantly sexual mode of transmission: Secondary | ICD-10-CM

## 2012-08-03 MED ORDER — LEVONORGESTREL-ETHINYL ESTRAD 0.1-20 MG-MCG PO TABS: ORAL_TABLET | ORAL | Status: DC

## 2012-08-03 NOTE — Progress Notes (Signed)
Subjective:    Breanna Flynn is a 41 y.o. female who presents for an annual exam. She complains of shortening menstrual cycles.  The patient is sexually active. GYN screening history: last pap: was normal. The patient wears seatbelts: yes. The patient participates in regular exercise: yes. Has the patient ever been transfused or tattooed?: no. The patient reports that there is not domestic violence in her life.   Menstrual History: OB History    Grav Para Term Preterm Abortions TAB SAB Ect Mult Living                  Menarche age: 48 Patient's last menstrual period was 07/28/2012.    The following portions of the patient's history were reviewed and updated as appropriate: allergies, current medications, past family history, past medical history, past social history, past surgical history and problem list.  Review of Systems A comprehensive review of systems was negative.    Objective:    BP 117/70  Pulse 77  Temp 96.7 F (35.9 C) (Oral)  Resp 16  Ht 5\' 6"  (1.676 m)  Wt 66.679 kg (147 lb)  BMI 23.73 kg/m2  LMP 07/28/2012  General Appearance:    Alert, cooperative, no distress, appears stated age  Head:    Normocephalic, without obvious abnormality, atraumatic  Eyes:    PERRL, conjunctiva/corneas clear, EOM's intact, fundi    benign, both eyes  Ears:    Normal TM's and external ear canals, both ears  Nose:   Nares normal, septum midline, mucosa normal, no drainage    or sinus tenderness  Throat:   Lips, mucosa, and tongue normal; teeth and gums normal  Neck:   Supple, symmetrical, trachea midline, no adenopathy;    thyroid:  no enlargement/tenderness/nodules; no carotid   bruit or JVD  Back:     Symmetric, no curvature, ROM normal, no CVA tenderness  Lungs:     Clear to auscultation bilaterally, respirations unlabored  Chest Wall:    No tenderness or deformity   Heart:    Regular rate and rhythm, S1 and S2 normal, no murmur, rub   or gallop  Breast Exam:    No tenderness,  masses, or nipple abnormality  Abdomen:     Soft, non-tender, bowel sounds active all four quadrants,    no masses, no organomegaly  Genitalia:    Normal female without lesion, discharge or tenderness, NSSA, NT, mobile, NT, no masses or tenderness     Extremities:   Extremities normal, atraumatic, no cyanosis or edema  Pulses:   2+ and symmetric all extremities  Skin:   Skin color, texture, turgor normal, no rashes or lesions  Lymph nodes:   Cervical, supraclavicular, and axillary nodes normal  Neurologic:   CNII-XII intact, normal strength, sensation and reflexes    throughout  .    Assessment:    Healthy female exam.    Plan:     Mammogram. Thin prep Pap smear.  She will start Levlite for menstrual control Screening fasting labs.

## 2012-08-05 LAB — COMPREHENSIVE METABOLIC PANEL
AST: 15 U/L (ref 0–37)
Albumin: 4 g/dL (ref 3.5–5.2)
Alkaline Phosphatase: 55 U/L (ref 39–117)
BUN: 12 mg/dL (ref 6–23)
Potassium: 4.2 mEq/L (ref 3.5–5.3)
Sodium: 141 mEq/L (ref 135–145)
Total Bilirubin: 0.7 mg/dL (ref 0.3–1.2)

## 2012-08-05 LAB — LIPID PANEL
Total CHOL/HDL Ratio: 1.7 Ratio
VLDL: 6 mg/dL (ref 0–40)

## 2012-08-05 LAB — TSH: TSH: 1.52 u[IU]/mL (ref 0.350–4.500)

## 2012-09-07 ENCOUNTER — Ambulatory Visit (INDEPENDENT_AMBULATORY_CARE_PROVIDER_SITE_OTHER): Payer: BC Managed Care – PPO | Admitting: Family Medicine

## 2012-09-07 ENCOUNTER — Encounter: Payer: Self-pay | Admitting: Family Medicine

## 2012-09-07 VITALS — BP 121/66 | HR 72 | Temp 97.5°F | Wt 149.0 lb

## 2012-09-07 DIAGNOSIS — B009 Herpesviral infection, unspecified: Secondary | ICD-10-CM

## 2012-09-07 DIAGNOSIS — B001 Herpesviral vesicular dermatitis: Secondary | ICD-10-CM

## 2012-09-07 MED ORDER — FIRST-MARYS MOUTHWASH MT SUSP: OROMUCOSAL | Status: DC

## 2012-09-07 MED ORDER — MAGIC MOUTHWASH: 5.0000 mL | Freq: Three times a day (TID) | ORAL | Status: DC

## 2012-09-07 MED ORDER — ACYCLOVIR 400 MG PO TABS: 400.0000 mg | ORAL_TABLET | Freq: Three times a day (TID) | ORAL | Status: DC

## 2012-09-07 MED ORDER — AMLEXANOX 5 % MT PSTE: PASTE | Freq: Four times a day (QID) | OROMUCOSAL | Status: DC

## 2012-09-07 NOTE — Progress Notes (Signed)
CC: Breanna Flynn is a 41 y.o. female is here for lesion in the mouth and Knee Pain   Subjective: HPI:  Patient complains of mouth sores since Sunday. Started with tingling in the mouth and on the lower lip that quickly evolved into blistering it is painful to the touch. She's been using an over-the-counter numbing agent but no other interventions. She's had identical symptoms years ago that required acyclovir. She denies any eye pain, fevers, chills, genital lesions, rashes, trouble swallowing, nor tongue or facial swelling other than some mild lower lip swelling.   Review Of Systems Outlined In HPI  Past Medical History  Diagnosis Date  . GERD (gastroesophageal reflux disease)      Family History  Problem Relation Age of Onset  . Diabetes Mother   . Hypertension Mother   . Diabetes Father   . Hypertension Father   . Hyperlipidemia      family history  . Heart attack Sister 85     History  Substance Use Topics  . Smoking status: Never Smoker   . Smokeless tobacco: Never Used  . Alcohol Use: Yes     Objective: Filed Vitals:   09/07/12 1344  BP: 121/66  Pulse: 72  Temp: 97.5 F (36.4 C)    General: Alert and Oriented, No Acute Distress HEENT: Pupils equal, round, reactive to light. Conjunctivae clear.  External ears unremarkable, canals clear with intact TMs with appropriate landmarks.  Middle ear appears open without effusion. Pink inferior turbinates.  Moist mucous membranes, pharynx without inflammation nor lesions.  Neck supple without palpable lymphadenopathy nor abnormal masses. At the centimeter shallow ulcerated lesion on the lower right lateral lip, 2 aphthous ulcers in the right internal cheek Lungs: Comfortable work of breathing no accessory muscle use Cardiac: Regular rate and rhythm.   Mental Status: No depression, anxiety, nor agitation. Skin: Warm and dry.  Assessment & Plan: [name] was seen today for lesion in the mouth and knee pain.  Diagnoses and  associated orders for this visit:  Cold sore - acyclovir (ZOVIRAX) 400 MG tablet; Take 1 tablet (400 mg total) by mouth 3 (three) times daily. For five days during outbreak. - amlexanox (APHTHASOL) 5 % paste; Use as directed in the mouth or throat 4 (four) times daily. Use dab on ulcers four times a day no more than 10 days. - Alum & Mag Hydroxide-Simeth (MAGIC MOUTHWASH) SOLN; Take 5 mLs by mouth 4 (four) times daily - after meals and at bedtime.    Patient has had success with speed of recovery of cold sores in the past using acyclovir, refilled her regimen for her however discussed my pessimism that it would robustly improve her symptoms given that his outside of 48 hours of onset. I doubt any harm would be done by starting it.  She will price Aplisol versus magic mouthwash for symptomatically control. Unfortunately she was late for her appointment and we were unable to fully address her left knee pain, she clarifies her chief complaint that it's not painful per se but does make some clicking sounds. She'll followup in 2 weeks if still has her attention.  Return if symptoms worsen or fail to improve.

## 2012-09-07 NOTE — Addendum Note (Signed)
Addended by: Laren Boom on: 09/07/2012 03:25 PM   Modules accepted: Orders

## 2012-11-23 ENCOUNTER — Telehealth: Payer: Self-pay | Admitting: *Deleted

## 2012-11-23 NOTE — Telephone Encounter (Signed)
Pt called  Stating that she is taking Orsythia ( Levlite OCP.)  Last month she decided to take the 3 weeks of continuous pills and on the 4th week start a new pill pack.  She had 1 day of brown spotting a 3 days of red bleeding but lighter than before.  She has not had any outbreak of cold sores this cycle.  Pt will continue with this pack and if she has any further BTB even on the continuous pills she will call office.

## 2012-12-02 ENCOUNTER — Encounter: Payer: Self-pay | Admitting: *Deleted

## 2012-12-09 ENCOUNTER — Encounter: Payer: Self-pay | Admitting: Family Medicine

## 2012-12-09 ENCOUNTER — Telehealth: Payer: Self-pay

## 2012-12-09 ENCOUNTER — Ambulatory Visit (INDEPENDENT_AMBULATORY_CARE_PROVIDER_SITE_OTHER): Payer: BC Managed Care – PPO | Admitting: Family Medicine

## 2012-12-09 VITALS — BP 117/72 | HR 103 | Temp 97.9°F | Ht 67.0 in | Wt 145.0 lb

## 2012-12-09 DIAGNOSIS — B009 Herpesviral infection, unspecified: Secondary | ICD-10-CM

## 2012-12-09 DIAGNOSIS — B001 Herpesviral vesicular dermatitis: Secondary | ICD-10-CM

## 2012-12-09 DIAGNOSIS — J209 Acute bronchitis, unspecified: Secondary | ICD-10-CM

## 2012-12-09 DIAGNOSIS — J019 Acute sinusitis, unspecified: Secondary | ICD-10-CM

## 2012-12-09 MED ORDER — AZITHROMYCIN 250 MG PO TABS: ORAL_TABLET | ORAL | Status: DC

## 2012-12-09 MED ORDER — HYDROCODONE-HOMATROPINE 5-1.5 MG/5ML PO SYRP: 5.0000 mL | ORAL_SOLUTION | Freq: Every evening | ORAL | Status: DC | PRN

## 2012-12-09 NOTE — Telephone Encounter (Signed)
Smitty Cords is awaiting a fax for a signed request on a breast biopsy. I faxed the order.

## 2012-12-09 NOTE — Progress Notes (Signed)
  Subjective:    Patient ID: Breanna Flynn, female    DOB: [birth year], 42 y.o.   MRN: [id number]  HPI 10 days of URI.  Says started with nasal congestion, fever, cough. Says now feels like her cough is worse and says it gradually moved into her chest. Sputum tastes bad. Says just feels "bad" and fatigued.  Fever resolved. Says cough is keeping up at night.  Doing some OTC cold meds and nyquail the first week.  Was then using IBU but stopped that bc of biopsy scheduled for today. She also has sores on her lips and mouth about 5 days.  Applied ASA and alcohol concoctions on it.    She is upset today because her mammogram came back abnormal. She scheduled for biopsy today and she is very worried about it.   Review of Systems     Objective:   Physical Exam  Constitutional: She is oriented to person, place, and time. She appears well-developed and well-nourished.  HENT:  Head: Normocephalic and atraumatic.  Right Ear: External ear normal.  Left Ear: External ear normal.  Nose: Nose normal.  Mouth/Throat: Oropharynx is clear and moist.  TMs and canals are clear.   Eyes: Conjunctivae and EOM are normal. Pupils are equal, round, and reactive to light.  Neck: Neck supple. No thyromegaly present.  Cardiovascular: Normal rate, regular rhythm and normal heart sounds.   Pulmonary/Chest: Effort normal. She has wheezes.  Diffuse inspiratory wheezing.    Lymphadenopathy:    She has no cervical adenopathy.  Neurological: She is alert and oriented to person, place, and time.  Skin: Skin is warm and dry.  She has several cold sores on her lips, tongue, and in her mouth mucosa.  Psychiatric: She has a normal mood and affect.          Assessment & Plan:  Sinusitis/Bronchitis - will treat with azithromycin since she has a penicillin allergy. This Caban is back in one week if she's not at least 50-60% better. Continue symptomatic care as needed. Given cough medicine for bedtime since the cough is  keeping her awake at night.  Cold sores - start her acyclovir. She had a prescription but was reported take it because of the biopsy today. She did note that was okay or not. She went ahead and took her tablet here in the office. Also encouraged hydration with Vaseline. Also recommend over-the-counter Maalox or Mylanta to apply to the ulcers. She can do this every 2-3 hours as needed. For the ones in her mouth she can swish and that the Mylanta or Maalox. If still not helping and consider Magic mouthwash.  Breast imaging abnormality-gave her reassurance. And posterior lip with her biopsy today. No family history her so hopefully everything will be fine.

## 2012-12-14 ENCOUNTER — Telehealth: Payer: Self-pay | Admitting: *Deleted

## 2012-12-14 DIAGNOSIS — R05 Cough: Secondary | ICD-10-CM

## 2012-12-14 NOTE — Telephone Encounter (Signed)
Pt notified & will go tomorrow for the cxr.

## 2012-12-14 NOTE — Telephone Encounter (Signed)
Ok let get CXR since still having a lot of chest symptoms and is not really feeling much better.

## 2012-12-14 NOTE — Telephone Encounter (Signed)
Pt calls today & states that she was dx with bronchitis last thurs & was given a z-pack.  She states that she has finished the med & still feels tired, has lots of sputum, and her chest hurts. Please advise.

## 2012-12-23 ENCOUNTER — Encounter: Payer: Self-pay | Admitting: Family Medicine

## 2012-12-24 ENCOUNTER — Encounter: Payer: Self-pay | Admitting: Family Medicine

## 2013-01-24 ENCOUNTER — Ambulatory Visit (INDEPENDENT_AMBULATORY_CARE_PROVIDER_SITE_OTHER): Payer: BC Managed Care – PPO | Admitting: Family Medicine

## 2013-01-24 ENCOUNTER — Encounter: Payer: Self-pay | Admitting: Family Medicine

## 2013-01-24 VITALS — BP 117/72 | HR 65 | Wt 147.0 lb

## 2013-01-24 DIAGNOSIS — E162 Hypoglycemia, unspecified: Secondary | ICD-10-CM

## 2013-01-24 DIAGNOSIS — K120 Recurrent oral aphthae: Secondary | ICD-10-CM

## 2013-01-24 MED ORDER — BENZOCAINE 10 % MT GEL: OROMUCOSAL | Status: DC

## 2013-01-24 MED ORDER — ACYCLOVIR 400 MG PO TABS: 400.0000 mg | ORAL_TABLET | Freq: Two times a day (BID) | ORAL | Status: AC

## 2013-01-24 NOTE — Progress Notes (Signed)
CC: Breanna Flynn is a 42 y.o. female is here for reocurring mouth ulcers   Subjective: HPI:  Patient complains of oral ulcers that came about this weekend. They are preceded by tingling at the locations that the ulcers eventually presented. This is accompanied by tingling of the entire lower lip with mild to moderate swelling Saturday. Interventions included Mylanta application along with Vaseline. No changes to personal care products prior to the swelling. Lower lip has been left with mild tingling and numbness ever since swelling disappeared.  She reports these ulcers were preceded by respiratory illness and substantial psychological stress due to a breast biopsy which fortunately turned out to be benign. She reports her ulcerations always occur during or soon after an illness. She believes she has a flare about every other month. She denies any recent or remote history of genital lesions or ulcerations elsewhere on the body. She denies fevers, chills, night sweats, trouble breathing, tongue swelling, trouble swallowing, neck pain, swollen lymph nodes, chest pain, shortness of breath. She is already taking L-lysene on a daily basis.  She brings in old blood work showing a glucose of 61 while fasting. She's unsure whether or not he's had low blood sugar in the past. She is confident that when she was fasting she had no tremor, anxiety, irritability, sweating, nervousness, nor feeling outside of her normal state of health.     Review Of Systems Outlined In HPI  Past Medical History  Diagnosis Date  . GERD (gastroesophageal reflux disease)      Family History  Problem Relation Age of Onset  . Diabetes Mother   . Hypertension Mother   . Diabetes Father   . Hypertension Father   . Hyperlipidemia      family history  . Heart attack Sister 39     History  Substance Use Topics  . Smoking status: Never Smoker   . Smokeless tobacco: Never Used  . Alcohol Use: Yes     Objective: Filed  Vitals:   01/24/13 1539  BP: 117/72  Pulse: 65    General: Alert and Oriented, No Acute Distress HEENT: Pupils equal, round, reactive to light. Conjunctivae clear.  External ears unremarkable, canals clear with intact TMs with appropriate landmarks.  Middle ear appears open without effusion. Pink inferior turbinates.  Moist mucous membranes, pharynx without inflammation nor lesions but she does have 3 mm aphthous ulcers on the right bucall mucosa, interior right inferior and superior lip .  Neck supple without palpable lymphadenopathy nor abnormal masses. Lungs: Clear to auscultation bilaterally, no wheezing/ronchi/rales.  Comfortable work of breathing. Good air movement. Extremities: No peripheral edema.  Strong peripheral pulses.  Mental Status: No depression, anxiety, nor agitation. Skin: Warm and dry.  Assessment & Plan: Caelin was seen today for reocurring mouth ulcers.  Diagnoses and associated orders for this visit:  Oral aphthous ulcer - benzocaine (ORAJEL) 10 % mucosal gel; Apply to cold sore every 4 hours as needed. - acyclovir (ZOVIRAX) 400 MG tablet; Take 1 tablet (400 mg total) by mouth 2 (two) times daily.  Hypoglycemia    Given recurrence of ulcers I've encouraged her to consider 1 mg of B12 on a daily basis, continuation of lysine, and consider starting suppression therapy of acyclovir. Symptomatic control using benzocaine. Hypoglycemia: Discussed that if she were asymptomatic at the time of the blood draw and that she has not had significant fluctuating weights or symptoms of hypoglycemia there was no need for further workup. Patient agreeable to plan  25 minutes spent face-to-face during visit today of which at least 50% was counseling or coordinating care regarding oral aphthous ulcer, hypoglycemia.   Return in about 4 weeks (around 02/21/2013).

## 2013-02-02 ENCOUNTER — Telehealth: Payer: Self-pay | Admitting: *Deleted

## 2013-02-02 DIAGNOSIS — K1379 Other lesions of oral mucosa: Secondary | ICD-10-CM

## 2013-02-02 NOTE — Telephone Encounter (Signed)
Pt calls and states you wanted her to call you with an update if she was not any better. She states she has 3 new ulcers in mouth now, the ones she had when seen you are getting better. Her lips feel weird she states "it feels like something crawling in them" and had a rash around mouth last week. Please advise

## 2013-02-02 NOTE — Telephone Encounter (Signed)
I was hoping she'd have a significant decrease in the frequency of these ulcers.  I'd like her to continue with the acyclovir but I'll place a ear nose and throat referral to see if they can provide more specialized recommendations.

## 2013-02-03 ENCOUNTER — Telehealth: Payer: Self-pay | Admitting: *Deleted

## 2013-02-03 DIAGNOSIS — K120 Recurrent oral aphthae: Secondary | ICD-10-CM

## 2013-02-03 NOTE — Telephone Encounter (Signed)
Pt calls & states that she can't get into ENT for another 2 weeks.  She states that her mouth swelling has gone down & now there's only 2 sores in her mouth.

## 2013-02-03 NOTE — Telephone Encounter (Signed)
Left message on pt's vm; cell and home

## 2013-02-04 MED ORDER — AMBULATORY NON FORMULARY MEDICATION: Status: DC

## 2013-02-04 MED ORDER — MAGIC MOUTHWASH W/LIDOCAINE: 5.0000 mL | Freq: Four times a day (QID) | ORAL | Status: DC | PRN

## 2013-02-04 NOTE — Telephone Encounter (Signed)
Pharmacy wants to know how much of each ingredient to use in magic mouthwash

## 2013-02-04 NOTE — Telephone Encounter (Signed)
rx faxed

## 2013-02-04 NOTE — Telephone Encounter (Signed)
Pt.notified

## 2013-02-04 NOTE — Telephone Encounter (Signed)
Noted, I've sent a Rx for magic mouthwash to her [facility] in case any sores flare up between now and ENT appointment

## 2013-02-04 NOTE — Telephone Encounter (Signed)
Sue Lush, A new more specific Rx has been placed in your inbox, can you please fax it off to her [facility]?

## 2013-03-09 ENCOUNTER — Telehealth: Payer: Self-pay | Admitting: *Deleted

## 2013-03-09 NOTE — Telephone Encounter (Signed)
Pt had BTB for one day.  She is taking OCP's continuously.  After questioning her she had intercourse the night before.  If she continues to have BTB Dr Marice Potter will have her double up on OCP's times 3 days if still BTB stop pills for 1 week and restart.  Explained that sometimes on OCP's the cervix changes and it maybe more friable.  Informed pt that we can always take a look and see if there is a polyp there.  I doubt this is the case since we last saw the pt about 3 months ago.

## 2013-03-15 ENCOUNTER — Telehealth: Payer: Self-pay | Admitting: *Deleted

## 2013-03-15 NOTE — Telephone Encounter (Signed)
Pt states she is still having breakthrough bleeding - Pt has doubled pills x2 days and will double again today - I adv pt to stop pills all together and start a new pack on Sunday - pt will probably have a regular cycle - she understood and will call back if Sx does not improve

## 2013-07-28 ENCOUNTER — Telehealth: Payer: Self-pay | Admitting: Family Medicine

## 2013-07-28 NOTE — Telephone Encounter (Signed)
Call pt: I put in order for repeat right breast diag mammo at [facility]

## 2013-07-29 NOTE — Telephone Encounter (Signed)
Pt informed.Breanna Flynn  

## 2013-08-17 ENCOUNTER — Ambulatory Visit: Payer: BC Managed Care – PPO | Admitting: Obstetrics & Gynecology

## 2014-04-17 ENCOUNTER — Encounter: Payer: Self-pay | Admitting: Family Medicine

## 2014-04-19 ENCOUNTER — Encounter: Payer: Self-pay | Admitting: Family Medicine

## 2014-07-23 ENCOUNTER — Emergency Department (INDEPENDENT_AMBULATORY_CARE_PROVIDER_SITE_OTHER)
Admission: EM | Admit: 2014-07-23 | Discharge: 2014-07-23 | Disposition: A | Discharge status: Home / Self Care | Payer: PRIVATE HEALTH INSURANCE | Source: Home / Self Care | Attending: Family Medicine | Admitting: Family Medicine

## 2014-07-23 ENCOUNTER — Encounter: Payer: Self-pay | Admitting: Emergency Medicine

## 2014-07-23 DIAGNOSIS — J209 Acute bronchitis, unspecified: Secondary | ICD-10-CM

## 2014-07-23 MED ORDER — GUAIFENESIN-CODEINE 100-10 MG/5ML PO SOLN: 5.0000 mL | Freq: Every evening | ORAL | Status: DC | PRN

## 2014-07-23 MED ORDER — ALBUTEROL SULFATE HFA 108 (90 BASE) MCG/ACT IN AERS
1.0000 | INHALATION_SPRAY | Freq: Four times a day (QID) | RESPIRATORY_TRACT | Status: DC | PRN
Start: 2014-07-23 — End: 2014-08-28

## 2014-07-23 MED ORDER — IPRATROPIUM-ALBUTEROL 0.5-2.5 (3) MG/3ML IN SOLN: 3.0000 mL | Freq: Once | RESPIRATORY_TRACT | Status: DC

## 2014-07-23 MED ORDER — PREDNISONE 10 MG PO TABS: 30.0000 mg | ORAL_TABLET | Freq: Every day | ORAL | Status: DC

## 2014-07-23 NOTE — Discharge Instructions (Signed)
Thank you for coming in today. Use albuterol as needed. Use codeine containing cough medication as needed for cough. Do not drive after taking this medication. Use prednisone for 5 days. Call or go to the emergency room if you get worse, have trouble breathing, have chest pains, or palpitations.    Acute Bronchitis Bronchitis is inflammation of the airways that extend from the windpipe into the lungs (bronchi). The inflammation often causes mucus to develop. This leads to a cough, which is the most common symptom of bronchitis.  In acute bronchitis, the condition usually develops suddenly and goes away over time, usually in a couple weeks. Smoking, allergies, and asthma can make bronchitis worse. Repeated episodes of bronchitis may cause further lung problems.  CAUSES Acute bronchitis is most often caused by the same virus that causes a cold. The virus can spread from person to person (contagious) through coughing, sneezing, and touching contaminated objects. SIGNS AND SYMPTOMS   Cough.   Fever.   Coughing up mucus.   Body aches.   Chest congestion.   Chills.   Shortness of breath.   Sore throat.  DIAGNOSIS  Acute bronchitis is usually diagnosed through a physical exam. Your health care provider will also ask you questions about your medical history. Tests, such as chest X-rays, are sometimes done to rule out other conditions.  TREATMENT  Acute bronchitis usually goes away in a couple weeks. Oftentimes, no medical treatment is necessary. Medicines are sometimes given for relief of fever or cough. Antibiotic medicines are usually not needed but may be prescribed in certain situations. In some cases, an inhaler may be recommended to help reduce shortness of breath and control the cough. A cool mist vaporizer may also be used to help thin bronchial secretions and make it easier to clear the chest.  HOME CARE INSTRUCTIONS  Get plenty of rest.   Drink enough fluids to keep  your urine clear or pale yellow (unless you have a medical condition that requires fluid restriction). Increasing fluids may help thin your respiratory secretions (sputum) and reduce chest congestion, and it will prevent dehydration.   Take medicines only as directed by your health care provider.  If you were prescribed an antibiotic medicine, finish it all even if you start to feel better.  Avoid smoking and secondhand smoke. Exposure to cigarette smoke or irritating chemicals will make bronchitis worse. If you are a smoker, consider using nicotine gum or skin patches to help control withdrawal symptoms. Quitting smoking will help your lungs heal faster.   Reduce the chances of another bout of acute bronchitis by washing your hands frequently, avoiding people with cold symptoms, and trying not to touch your hands to your mouth, nose, or eyes.   Keep all follow-up visits as directed by your health care provider.  SEEK MEDICAL CARE IF: Your symptoms do not improve after 1 week of treatment.  SEEK IMMEDIATE MEDICAL CARE IF:  You develop an increased fever or chills.   You have chest pain.   You have severe shortness of breath.  You have bloody sputum.   You develop dehydration.  You faint or repeatedly feel like you are going to pass out.  You develop repeated vomiting.  You develop a severe headache. MAKE SURE YOU:   Understand these instructions.  Will watch your condition.  Will get help right away if you are not doing well or get worse. Document Released: 10/23/2004 Document Revised: 01/30/2014 Document Reviewed: 03/08/2013 [facility] Patient Information 2015 [place], [place].  This information is not intended to replace advice given to you by your health care provider. Make sure you discuss any questions you have with your health care provider.  Laryngitis At the top of your windpipe is your voice box. It is the source of your voice. Inside your voice box are 2 bands of  muscles called vocal cords. When you breathe, your vocal cords are relaxed and open so that air can get into the lungs. When you decide to say something, these cords come together and vibrate. The sound from these vibrations goes into your throat and comes out through your mouth as sound. Laryngitis is an inflammation of the vocal cords that causes hoarseness, cough, loss of voice, sore throat, and dry throat. Laryngitis can be temporary (acute) or long-term (chronic). Most cases of acute laryngitis improve with time.Chronic laryngitis lasts for more than 3 weeks. CAUSES Laryngitis can often be related to excessive smoking, talking, or yelling, as well as inhalation of toxic fumes and allergies. Acute laryngitis is usually caused by a viral infection, vocal strain, measles or mumps, or bacterial infections. Chronic laryngitis is usually caused by vocal cord strain, vocal cord injury, postnasal drip, growths on the vocal cords, or acid reflux. SYMPTOMS   Cough.  Sore throat.  Dry throat. RISK FACTORS  Respiratory infections.  Exposure to irritating substances, such as cigarette smoke, excessive amounts of alcohol, stomach acids, and workplace chemicals.  Voice trauma, such as vocal cord injury from shouting or speaking too loud. DIAGNOSIS  Your cargiver will perform a physical exam. During the physical exam, your caregiver will examine your throat. The most common sign of laryngitis is hoarseness. Laryngoscopy may be necessary to confirm the diagnosis of this condition. This procedure allows your caregiver to look into the larynx. HOME CARE INSTRUCTIONS  Drink enough fluids to keep your urine clear or pale yellow.  Rest until you no longer have symptoms or as directed by your caregiver.  Breathe in moist air.  Take all medicine as directed by your caregiver.  Do not smoke.  Talk as little as possible (this includes whispering).  Write on paper instead of talking until your voice is  back to normal.  Follow up with your caregiver if your condition has not improved after 10 days. SEEK MEDICAL CARE IF:   You have trouble breathing.  You cough up blood.  You have persistent fever.  You have increasing pain.  You have difficulty swallowing. MAKE SURE YOU:  Understand these instructions.  Will watch your condition.  Will get help right away if you are not doing well or get worse. Document Released: 09/15/2005 Document Revised: 12/08/2011 Document Reviewed: 11/21/2010 [facility] Patient Information 2015 [place], [place]. This information is not intended to replace advice given to you by your health care provider. Make sure you discuss any questions you have with your health care provider.

## 2014-07-23 NOTE — ED Notes (Signed)
Reports deepening cough over past 6 days; some congestion, hoarseness, aches and fatigue, mild sore throat; denies fever. No recent OTC.

## 2014-07-23 NOTE — ED Provider Notes (Signed)
Breanna Flynn is a 43 y.o. female who presents to Urgent Care today for cough. Patient has a barking nonproductive cough associated with body aches and hoarse voice. She denies any fevers nausea vomiting diarrhea or shortness of breath. Her cough was initially productive a few days ago. She's been sick for about a week. She has tried over-the-counter medications which have not helped.   Past Medical History  Diagnosis Date  . GERD (gastroesophageal reflux disease)    History  Substance Use Topics  . Smoking status: Never Smoker   . Smokeless tobacco: Never Used  . Alcohol Use: Yes   ROS as above Medications: No current facility-administered medications for this encounter.   Current Outpatient Prescriptions  Medication Sig Dispense Refill  . AMBULATORY NON FORMULARY MEDICATION Diphenhydramine 12.5mg /21mL 39mL, Maalox 51mL, Sucralfate 4g.  Swish and spit 43mL three times a day as needed.  180 mL  2  . benzocaine (ORAJEL) 10 % mucosal gel Apply to cold sore every 4 hours as needed.  5.3 g  1  . lactobacillus acidophilus (BACID) TABS Take 2 tablets by mouth 3 (three) times daily.      Marland Kitchen levonorgestrel-ethinyl estradiol (AVIANE,ALESSE,LESSINA) 0.1-20 MG-MCG tablet Take 1 tablet by mouth daily. Take one pill daily continuously discarding the inactive pills.        Exam:  BP 122/77  Pulse 84  Temp(Src) 97.9 F (36.6 C) (Oral)  Ht 5\' 7"  (1.702 m)  Wt 145 lb (65.772 kg)  BMI 22.71 kg/m2  SpO2 100% Gen: Well NAD HEENT: EOMI,  MMM posterior pharynx with cobblestoning. Normal tympanic membranes bilaterally. Lungs: Normal work of breathing. CTABL hoarse voice Heart: RRR no MRG Abd: NABS, Soft. Nondistended, Nontender Exts: Brisk capillary refill, warm and well perfused.   Patient was given a 2.5/0.5 mg DuoNeb nebulizer treatment, and felt better.  No results found for this or any previous visit (from the past 24 hour(s)). No results found.  Assessment and Plan: 43 y.o. female with viral  bronchitis and possibly laryngitis. Treatment with prednisone and Atrovent nasal spray and codeine containing cough medication. Additionally will use albuterol as patient perceived benefit with  Discussed warning signs or symptoms. Please see discharge instructions. Patient expresses understanding.     Gregor Hams, MD 07/23/14 [phone]

## 2014-08-28 ENCOUNTER — Encounter: Payer: Self-pay | Admitting: Physician Assistant

## 2014-08-28 ENCOUNTER — Ambulatory Visit (INDEPENDENT_AMBULATORY_CARE_PROVIDER_SITE_OTHER): Payer: PRIVATE HEALTH INSURANCE | Admitting: Physician Assistant

## 2014-08-28 VITALS — BP 121/78 | HR 67 | Ht 67.0 in | Wt 142.0 lb

## 2014-08-28 DIAGNOSIS — B001 Herpesviral vesicular dermatitis: Secondary | ICD-10-CM

## 2014-08-28 MED ORDER — VALACYCLOVIR HCL 1 G PO TABS: ORAL_TABLET | ORAL | Status: DC

## 2014-08-28 NOTE — Progress Notes (Signed)
   Subjective:    Patient ID: Breanna Flynn, female    DOB: Dec 09, 1970, 43 y.o.   MRN: [id number]  HPI Pt presents to the clinic with cold sore. She has hx of but has had acylovir to use. rx ran out. Lesion is upper lip to the right. Not tried anything to make better.     Review of Systems  All other systems reviewed and are negative.      Objective:   Physical Exam  HENT:  Head:            Assessment & Plan:  Cold sore- given valtrex since treatment is one day and less tablets for outbreak.

## 2014-09-25 ENCOUNTER — Ambulatory Visit (INDEPENDENT_AMBULATORY_CARE_PROVIDER_SITE_OTHER): Payer: PRIVATE HEALTH INSURANCE | Admitting: Family Medicine

## 2014-09-25 ENCOUNTER — Encounter: Payer: Self-pay | Admitting: Family Medicine

## 2014-09-25 ENCOUNTER — Ambulatory Visit (INDEPENDENT_AMBULATORY_CARE_PROVIDER_SITE_OTHER): Payer: PRIVATE HEALTH INSURANCE

## 2014-09-25 VITALS — BP 111/70 | HR 73 | Wt 143.0 lb

## 2014-09-25 DIAGNOSIS — M79672 Pain in left foot: Secondary | ICD-10-CM

## 2014-09-25 NOTE — Progress Notes (Signed)
CC: Breanna Flynn is a 43 y.o. female is here for left ankle pain   Subjective: HPI:    1-1/2 weeks of left foot pain that has been presenton a daily basis.  It seems to be worse first thing in the morning. Overall is moderate in severity. It's worse with weightbearing. It's localized on the top of the foot  Just anterior to the ankle and also on the dorsal surface of  The foot near the head of the first metatarsal.  Symptoms came on suddenly when she was kickboxing, if she can recall  The foot that hurts was doing the kicking she had sudden onset of pain. She's been soaking it skin warm and hot water, she's been keeping it elevated, she's also tried to decrease activity on her feet. Pain is bad enough to where she could only do 7 minutes of an elliptical late last week.  Pain occasionally radiates of the lateral posterior aspect of the calf.   She denies any overlying skin changes, bruising, swelling, nor tender to light touch.   Review Of Systems Outlined In HPI  Past Medical History  Diagnosis Date  . GERD (gastroesophageal reflux disease)     Past Surgical History  Procedure Laterality Date  . Endovenous ablation saphenous vein w/ laser  09-2009  . [facility]      LT leg  . Mass removal      RT side  . Hernia repair  02-2008   Family History  Problem Relation Age of Onset  . Diabetes Mother   . Hypertension Mother   . Diabetes Father   . Hypertension Father   . Hyperlipidemia      family history  . Heart attack Sister 60    History   Social History  . Marital Status: Married    Spouse Name: N/A    Number of Children: N/A  . Years of Education: N/A   Occupational History  . Not on file.   Social History Main Topics  . Smoking status: Never Smoker   . Smokeless tobacco: Never Used  . Alcohol Use: Yes  . Drug Use: No  . Sexual Activity: Yes     Comment: Preschool asst, BS accounting , married, 2 kids, regularly exercises, 3 caffeine drinks daily.   Other Topics Concern   . Not on file   Social History Narrative     Objective: BP 111/70 mmHg  Pulse 73  Wt 143 lb (64.864 kg)  LMP 09/11/2014  General: Alert and Oriented, No Acute Distress HEENT: Pupils equal, round, reactive to light. Conjunctivae clear.   Moist mucous membranes Lungs: clear and comfortable work of breathing Cardiac: Regular rate and rhythm. Extremities: No peripheral edema.  Strong peripheral pulses.   left foot exam: No pain at the medial or lateral malleoli, no pain at the base of the fifth metatarsal. No pain with direct palpation of the navicular. There is no overlying skin changes or swelling anywhere on the foot. Pain is reproduced with dorsal/plantar  Manipulation of the heads of the first and second metatarsal. Pain is also reproduced with resisted  Dorsiflexion.  No pain with resisted inversion or eversion of the ankle /foot. Mental Status: No depression, anxiety, nor agitation. Skin: Warm and dry.  Assessment & Plan: Breanna Flynn was seen today for left ankle pain.  Diagnoses and associated orders for this visit:  Left foot pain - DG Foot Complete Left; Future     X-rays were obtained showing no acute abnormality or  or degenerative changes. I believe she has probably suffered some ligamentous damage to multiple sites in the foot and that this would improve with immobilization for the next 3 weeks. She declines pain medication. She was fitted in a cam boot to be worn during all waking hours and less bathing or sleeping or changing clothes. Follow-up in 3 weeks if no improvement.   Return if symptoms worsen or fail to improve.

## 2015-04-25 ENCOUNTER — Ambulatory Visit: Payer: PRIVATE HEALTH INSURANCE | Admitting: Obstetrics & Gynecology

## 2015-05-01 ENCOUNTER — Ambulatory Visit (INDEPENDENT_AMBULATORY_CARE_PROVIDER_SITE_OTHER): Payer: Managed Care, Other (non HMO) | Admitting: Obstetrics & Gynecology

## 2015-05-01 ENCOUNTER — Encounter: Payer: Self-pay | Admitting: Obstetrics & Gynecology

## 2015-05-01 VITALS — BP 110/63 | HR 72 | Resp 16 | Ht 66.0 in | Wt 144.0 lb

## 2015-05-01 DIAGNOSIS — N943 Premenstrual tension syndrome: Secondary | ICD-10-CM

## 2015-05-01 DIAGNOSIS — Z0000 Encounter for general adult medical examination without abnormal findings: Secondary | ICD-10-CM

## 2015-05-01 DIAGNOSIS — Z1151 Encounter for screening for human papillomavirus (HPV): Secondary | ICD-10-CM

## 2015-05-01 DIAGNOSIS — Z124 Encounter for screening for malignant neoplasm of cervix: Secondary | ICD-10-CM | POA: Diagnosis not present

## 2015-05-01 DIAGNOSIS — Z01419 Encounter for gynecological examination (general) (routine) without abnormal findings: Secondary | ICD-10-CM

## 2015-05-01 DIAGNOSIS — L709 Acne, unspecified: Secondary | ICD-10-CM | POA: Diagnosis not present

## 2015-05-01 MED ORDER — ACYCLOVIR 400 MG PO TABS: 400.0000 mg | ORAL_TABLET | Freq: Three times a day (TID) | ORAL | Status: DC

## 2015-05-01 MED ORDER — LEVONORGEST-ETH ESTRAD 91-DAY 0.15-0.03 &0.01 MG PO TABS: 1.0000 | ORAL_TABLET | Freq: Every day | ORAL | Status: DC

## 2015-05-01 NOTE — Progress Notes (Signed)
Subjective:    Breanna Flynn is a 44 y.o. MW p2( 63 and 80 yo sons) female who presents for an annual exam. The patient has no complaints today except that she is "unbearable" with moodiness the week prior to her period. She gets nice again when her period starts. She is also getting bad acne the week prior to her period. She started doxy per her dermatologist. The patient is sexually active. GYN screening history: last pap: was normal. The patient wears seatbelts: yes. The patient participates in regular exercise: yes. Has the patient ever been transfused or tattooed?: no. The patient reports that there is not domestic violence in her life.   Menstrual History: OB History    No data available      Menarche age: 9  Patient's last menstrual period was 04/13/2015.    The following portions of the patient's history were reviewed and updated as appropriate: allergies, current medications, past family history, past medical history, past social history, past surgical history and problem list.  Review of Systems A comprehensive review of systems was negative.  Needs mammogram. Married for 18 years, denies dyspareunia. 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 Husband had a vasectomy. Periods q 26 days for 3 days.   Objective:    BP 110/63 mmHg  Pulse 72  Resp 16  Ht 5\' 6"  (1.676 m)  Wt 144 lb (65.318 kg)  BMI 23.25 kg/m2  LMP 04/13/2015  General Appearance:    Alert, cooperative, no distress, appears stated age  Head:    Normocephalic, without obvious abnormality, atraumatic  Eyes:    PERRL, conjunctiva/corneas clear, EOM's intact, fundi    benign, both eyes  Ears:    Normal TM's and external ear canals, both ears  Nose:   Nares normal, septum midline, mucosa normal, no drainage    or sinus tenderness  Throat:   Lips, mucosa, and tongue normal; teeth and gums normal  Neck:   Supple, symmetrical, trachea midline, no adenopathy;    thyroid:  no  enlargement/tenderness/nodules; no carotid   bruit or JVD  Back:     Symmetric, no curvature, ROM normal, no CVA tenderness  Lungs:     Clear to auscultation bilaterally, respirations unlabored  Chest Wall:    No tenderness or deformity   Heart:    Regular rate and rhythm, S1 and S2 normal, no murmur, rub   or gallop  Breast Exam:    No tenderness, masses, or nipple abnormality  Abdomen:     Soft, non-tender, bowel sounds active all four quadrants,    no masses, no organomegaly  Genitalia:    Normal female without lesion, discharge or tenderness, NSSmidplane, NT, normal adnexal exam     Extremities:   Extremities normal, atraumatic, no cyanosis or edema  Pulses:   2+ and symmetric all extremities  Skin:   Skin color, texture, turgor normal, no rashes or lesions  Lymph nodes:   Cervical, supraclavicular, and axillary nodes normal  Neurologic:   CNII-XII intact, normal strength, sensation and reflexes    throughout  .    Assessment:    Healthy female exam.   Acne and PMDD symptoms   Plan:     Breast self exam technique reviewed and patient encouraged to perform self-exam monthly. Mammogram. Thin prep Pap smear. with cotesting She is going to try Camrese (extended cycle) OCPs and see if her issues resolve. She will need a BP check in 4-7 weeks.

## 2015-05-03 LAB — CYTOLOGY - PAP

## 2015-08-17 ENCOUNTER — Encounter: Payer: Self-pay | Admitting: Family Medicine

## 2015-08-17 ENCOUNTER — Ambulatory Visit (INDEPENDENT_AMBULATORY_CARE_PROVIDER_SITE_OTHER): Payer: Managed Care, Other (non HMO) | Admitting: Family Medicine

## 2015-08-17 VITALS — BP 118/71 | HR 71 | Temp 97.9°F | Resp 16 | Wt 139.8 lb

## 2015-08-17 DIAGNOSIS — L700 Acne vulgaris: Secondary | ICD-10-CM | POA: Diagnosis not present

## 2015-08-17 MED ORDER — DOXYCYCLINE HYCLATE 100 MG PO TABS: 100.0000 mg | ORAL_TABLET | Freq: Two times a day (BID) | ORAL | Status: DC

## 2015-08-17 NOTE — Progress Notes (Signed)
   Subjective:    Patient ID: Breanna Flynn, female    DOB: [birth year], 44 y.o.   MRN: [id number]  HPI Has been getting more cystic acne on her chin. Worse near her cycle. Saw dermatology in June and they put her on doxy for one month. That really helped but then flared back up.  Now has a large lesion that is really painful and making her jaw hurt. No fever, or chills. She has been using some warm compresses but says she has a really hard time leaving it alone and tends to pick at them when they first popped out.   Review of Systems     Objective:   Physical Exam  Constitutional: She appears well-developed and well-nourished.  HENT:  Head: Normocephalic and atraumatic.  Skin: Skin is warm and dry.  She has a large pustule on her chin. No active drainage. Some surrounding erythema. Few smaller pink papules on her chin.   Psychiatric: She has a normal mood and affect. Her behavior is normal.         Assessment & Plan:  Pustule drainage -  - will tx with doxy and extend for one month for he cystic acne.  Call if not better in one week or if getting worse. Continue warm compresses and avoid picking and squeezing at it. We also discussed trying to avoid touching the skin on her face. Dr. Christen Bame actually discussed putting her on birth control since her breakouts tend to be hormonally triggered. She had an really decided if that's what she wants to do or not we discussed that further today as an option.

## 2015-08-29 IMAGING — CR DG FOOT COMPLETE 3+V*L*
3 series · 3 of 3 positions shown · non-contrast
Comparison: None.

CLINICAL DATA: Strained foot while kickboxing.  Imaged evaluation.

EXAM:
LEFT FOOT - COMPLETE 3+ VIEW

[view not recorded (1 of 3)]
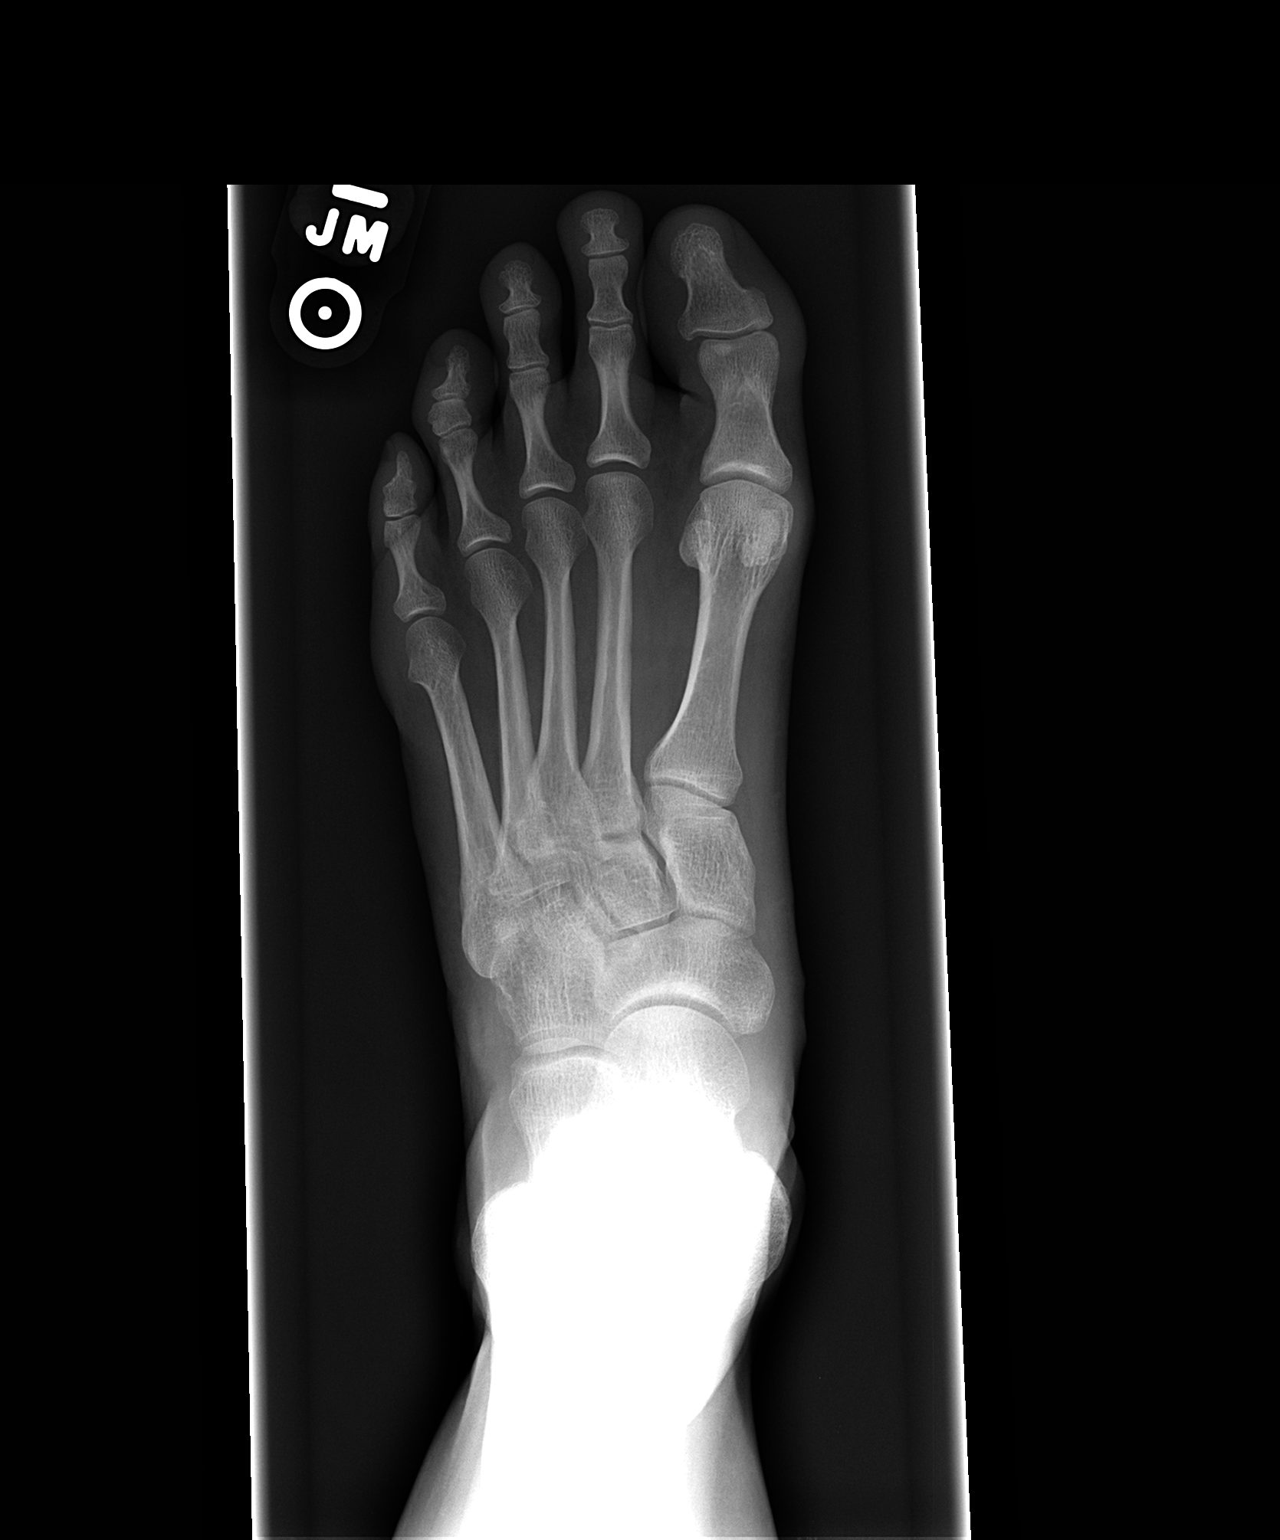

[view not recorded (2 of 3)]
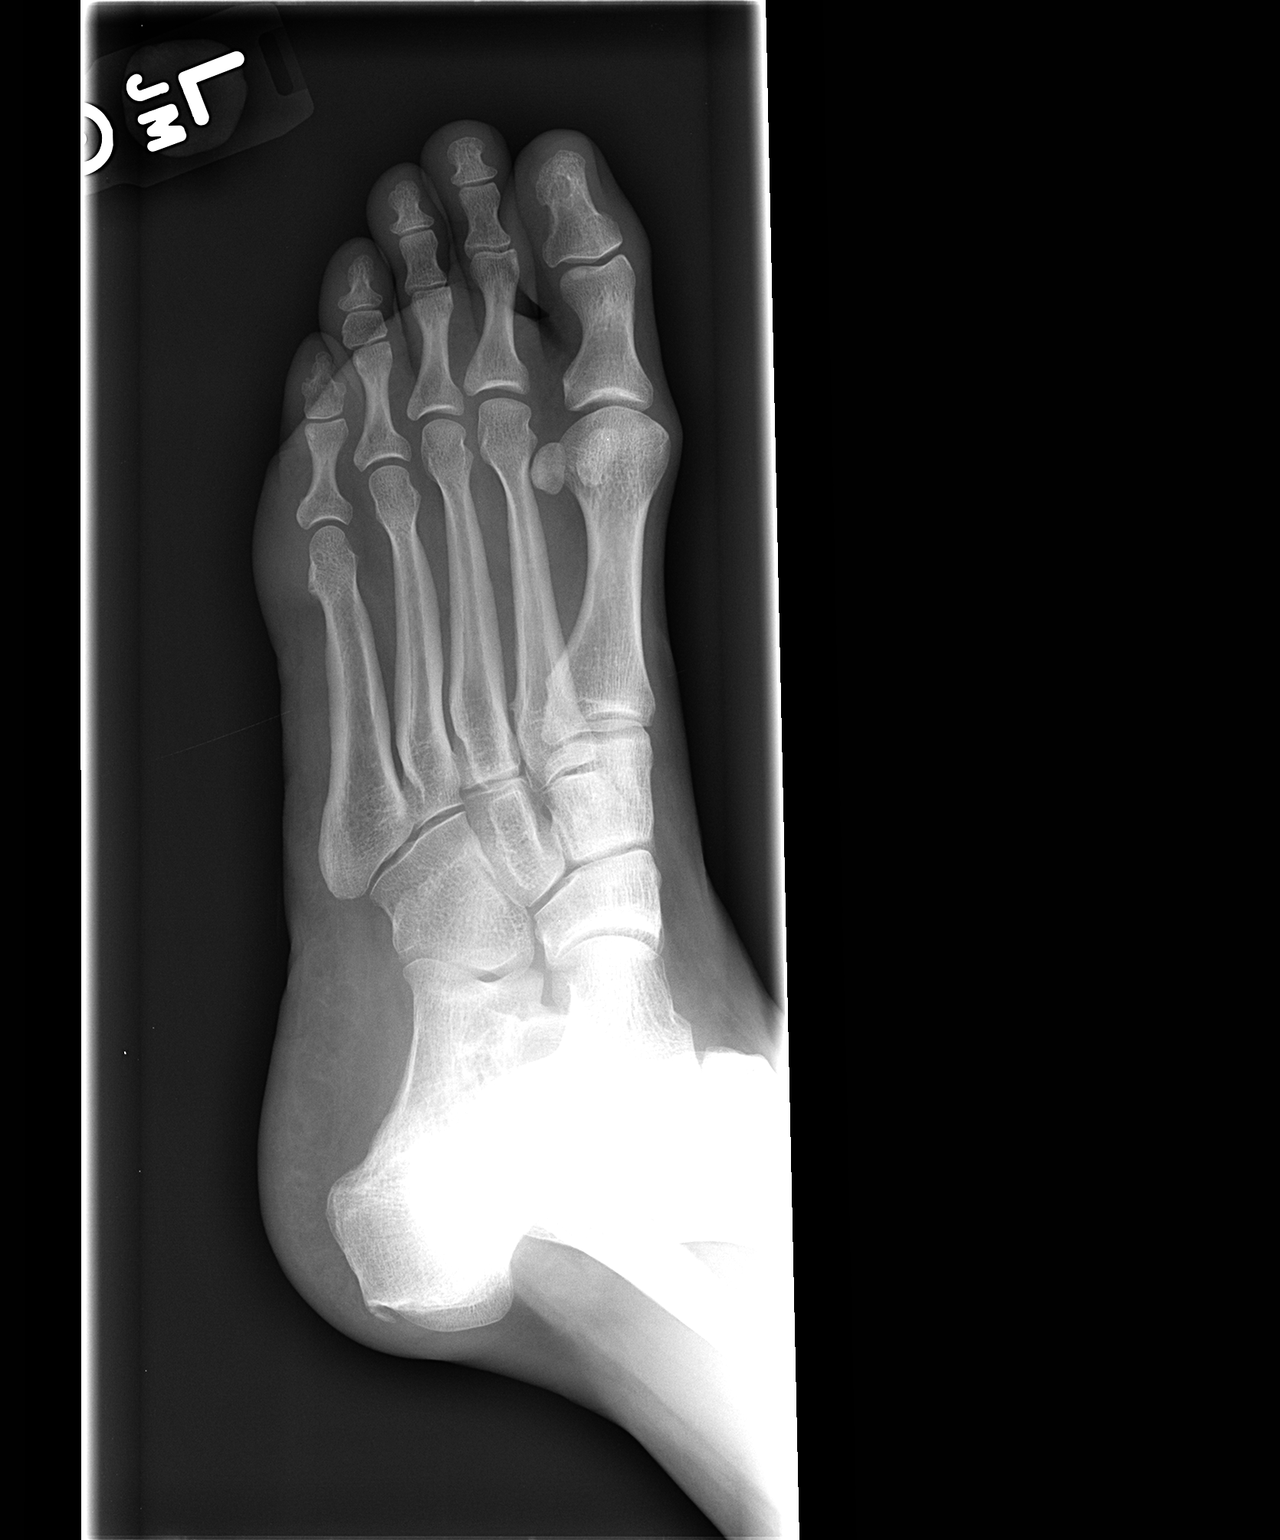

[view not recorded (3 of 3)]
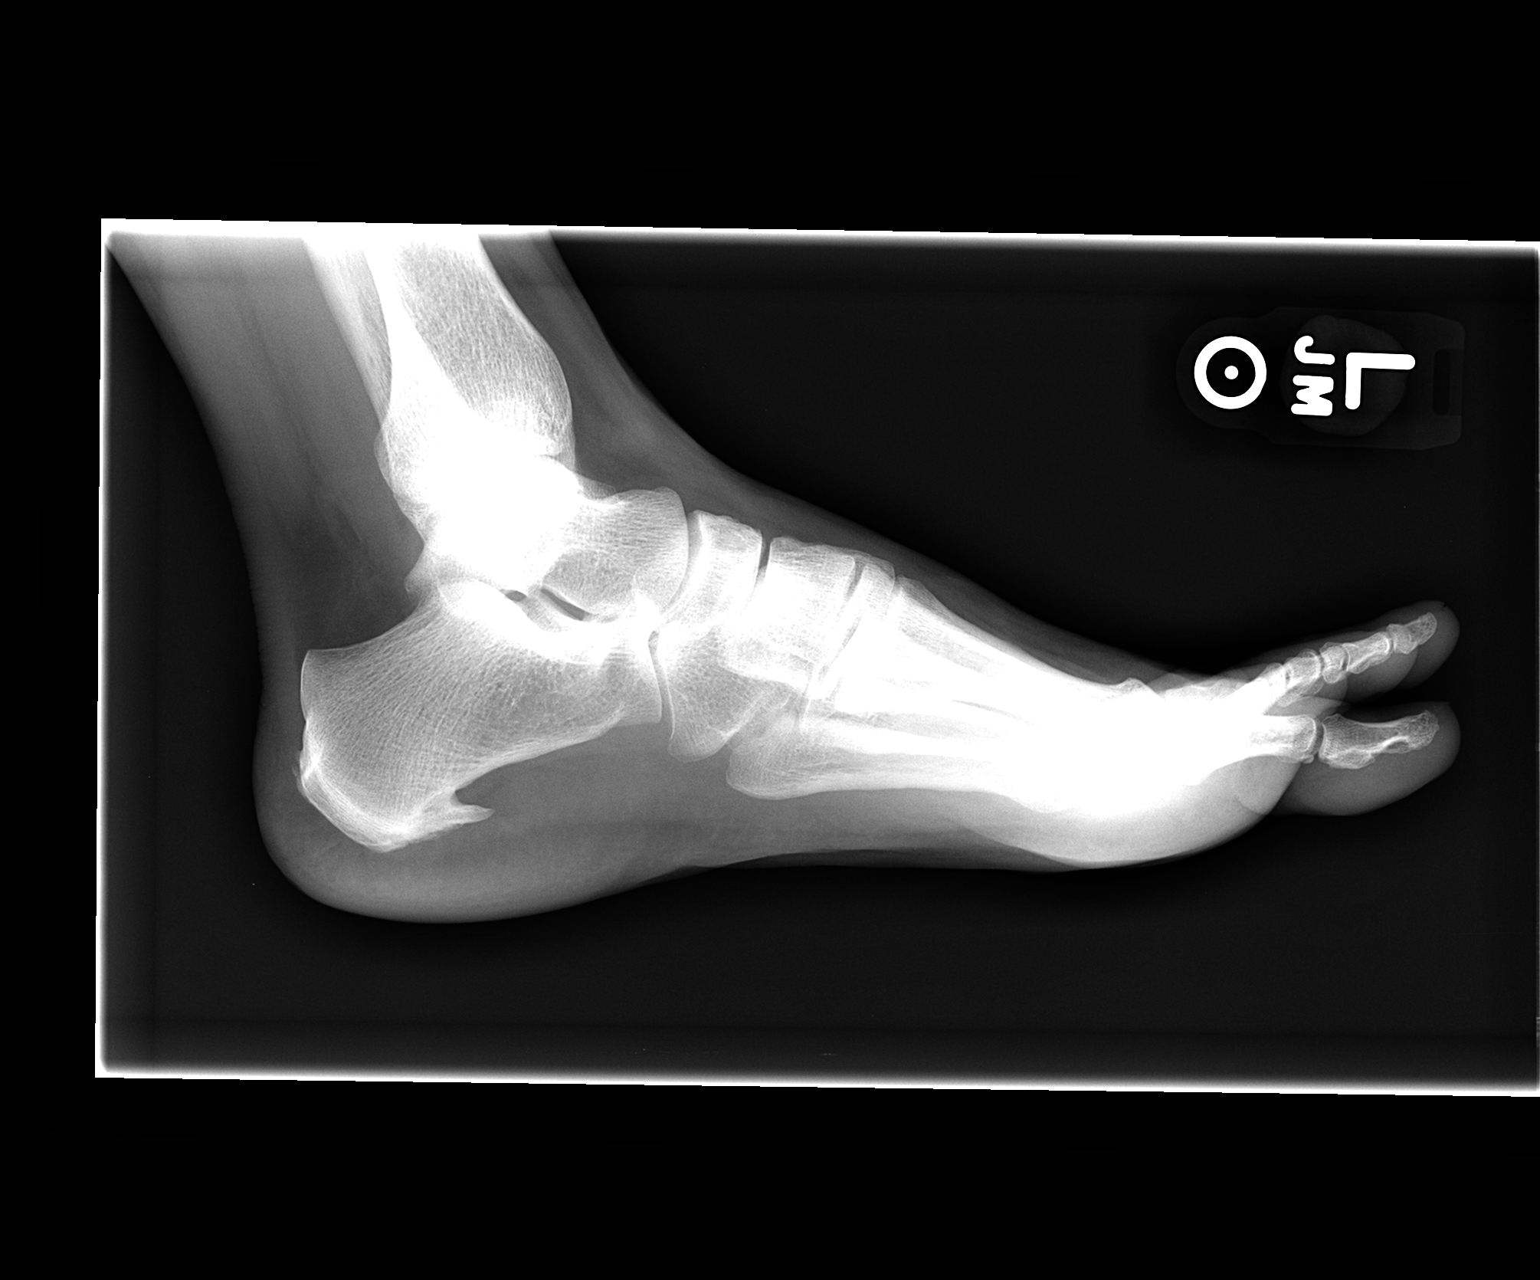

[3 of 3 positions shown; findings below may reference images not displayed]

FINDINGS: There is no evidence of fracture or dislocation. There is no
evidence of arthropathy or other focal bone abnormality. Soft
tissues are unremarkable.
IMPRESSION: No acute abnormality.

## 2015-12-09 ENCOUNTER — Emergency Department
Admission: EM | Admit: 2015-12-09 | Discharge: 2015-12-09 | Disposition: A | Discharge status: Home / Self Care | Payer: Managed Care, Other (non HMO) | Source: Home / Self Care | Attending: Family Medicine | Admitting: Family Medicine

## 2015-12-09 ENCOUNTER — Encounter: Payer: Self-pay | Admitting: Emergency Medicine

## 2015-12-09 DIAGNOSIS — R0981 Nasal congestion: Secondary | ICD-10-CM

## 2015-12-09 DIAGNOSIS — J020 Streptococcal pharyngitis: Secondary | ICD-10-CM

## 2015-12-09 DIAGNOSIS — H9201 Otalgia, right ear: Secondary | ICD-10-CM | POA: Diagnosis not present

## 2015-12-09 LAB — POCT RAPID STREP A (OFFICE): Rapid Strep A Screen: POSITIVE — AB

## 2015-12-09 MED ORDER — AZITHROMYCIN 250 MG PO TABS: 250.0000 mg | ORAL_TABLET | Freq: Every day | ORAL | Status: DC

## 2015-12-09 NOTE — ED Provider Notes (Signed)
CSN: [id number]     Arrival date & time 12/09/15  1146 History   First MD Initiated Contact with Patient 12/09/15 1235     Chief Complaint  Patient presents with  . Sore Throat   (Consider location/radiation/quality/duration/timing/severity/associated sxs/prior Treatment) HPI  Pt is a 45yo female presenting to [facility] with c/o sore throat that is moderate to severe, worse with talking and swallowing.  Symptoms started yesterday.  Pt reports seeing white spots in her throat. 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 Associated nasal congestion and Right sided ear pain.  Denies fever, chills, n/v/d.   Past Medical History  Diagnosis Date  . GERD (gastroesophageal reflux disease)    Past Surgical History  Procedure Laterality Date  . Endovenous ablation saphenous vein w/ laser  09-2009  . [facility]a      LT leg  . Mass removal      RT side  . Hernia repair  02-2008   Family History  Problem Relation Age of Onset  . Diabetes Mother   . Hypertension Mother   . Diabetes Father   . Hypertension Father   . Hyperlipidemia      family history  . Heart attack Sister 30   Social History  Substance Use Topics  . Smoking status: Never Smoker   . Smokeless tobacco: Never Used  . Alcohol Use: Yes   OB History    No data available     Review of Systems  Constitutional: Negative for fever and chills.  HENT: Positive for congestion, ear pain (Right) and sore throat. Negative for trouble swallowing and voice change.   Respiratory: Negative for cough and shortness of breath.   Cardiovascular: Negative for chest pain and palpitations.  Gastrointestinal: Negative for nausea, vomiting, abdominal pain and diarrhea.  Musculoskeletal: Positive for myalgias and arthralgias. Negative for back pain.  Skin: Negative for rash.  Neurological: Positive for headaches. Negative for dizziness and light-headedness.    Allergies  Penicillins  Home Medications   Prior to  Admission medications   Medication Sig Start Date End Date Taking? Authorizing Provider  azithromycin (ZITHROMAX) 250 MG tablet Take 1 tablet (250 mg total) by mouth daily. Take first 2 tablets together, then 1 every day until finished. 12/09/15   Noland Fordyce, PA-C   Meds Ordered and Administered this Visit  Medications - No data to display  BP 101/68 mmHg  Pulse 85  Temp(Src) 99.1 F (37.3 C) (Oral)  Ht 5\' 7"  (1.702 m)  Wt 150 lb 8 oz (68.266 kg)  BMI 23.57 kg/m2  SpO2 98%  LMP 12/06/2015 No data found.   Physical Exam  Constitutional: She appears well-developed and well-nourished. No distress.  HENT:  Head: Normocephalic and atraumatic.  Right Ear: A middle ear effusion is present.  Left Ear: A middle ear effusion is present.  Nose: Mucosal edema present.  Mouth/Throat: Uvula is midline and mucous membranes are normal. Oropharyngeal exudate, posterior oropharyngeal edema and posterior oropharyngeal erythema present. No tonsillar abscesses.  Eyes: Conjunctivae are normal. No scleral icterus.  Neck: Normal range of motion. Neck supple.  Cardiovascular: Normal rate, regular rhythm and normal heart sounds.   Pulmonary/Chest: Effort normal and breath sounds normal. No stridor. No respiratory distress. She has no wheezes. She has no rales.  Abdominal: Soft. She exhibits no distension. There is no tenderness.  Musculoskeletal: Normal range of motion.  Lymphadenopathy:    She has cervical adenopathy.  Neurological: She is alert.  Skin: Skin  is warm and dry. She is not diaphoretic.  Nursing note and vitals reviewed.   ED Course  Procedures (including critical care time)  Labs Review Labs Reviewed  POCT RAPID STREP A (OFFICE) - Abnormal; Notable for the following:    Rapid Strep A Screen Positive (*)    All other components within normal limits    Imaging Review No results found.   MDM   1. Streptococcal sore throat   2. Sinus congestion   3. Otalgia, right     Rapid strep: positive No evidence of peritonsillar abscess.  PCN allergic, unsure of reaction, believes hives as a baby. Rx: azithromycin  Advised pt to use acetaminophen and ibuprofen as needed for fever and pain, and saltwater gargles. Encouraged rest and fluids. F/u with PCP in 1 week if not improving, sooner if worsening. Pt verbalized understanding and agreement with tx plan.      Noland Fordyce, PA-C 12/09/15 1311

## 2015-12-09 NOTE — ED Notes (Signed)
Pt c/o extreme sore throat x 1 day, low grade fever, white spots in throat.

## 2015-12-09 NOTE — Discharge Instructions (Signed)

## 2016-04-10 LAB — HM MAMMOGRAPHY

## 2016-04-22 ENCOUNTER — Encounter: Payer: Self-pay | Admitting: Family Medicine

## 2017-07-29 ENCOUNTER — Encounter: Payer: Self-pay | Admitting: Physician Assistant

## 2017-07-29 ENCOUNTER — Ambulatory Visit (INDEPENDENT_AMBULATORY_CARE_PROVIDER_SITE_OTHER): Payer: Managed Care, Other (non HMO) | Admitting: Physician Assistant

## 2017-07-29 VITALS — BP 117/70 | HR 57 | Ht 67.0 in | Wt 143.0 lb

## 2017-07-29 DIAGNOSIS — B001 Herpesviral vesicular dermatitis: Secondary | ICD-10-CM | POA: Diagnosis not present

## 2017-07-29 DIAGNOSIS — B009 Herpesviral infection, unspecified: Secondary | ICD-10-CM | POA: Diagnosis not present

## 2017-07-29 MED ORDER — ACYCLOVIR 400 MG PO TABS: 400.0000 mg | ORAL_TABLET | Freq: Three times a day (TID) | ORAL | 5 refills | Status: AC

## 2017-07-29 MED ORDER — VALACYCLOVIR HCL 1 G PO TABS: ORAL_TABLET | ORAL | 5 refills | Status: DC

## 2017-07-29 NOTE — Patient Instructions (Signed)
  Cold Sore A cold sore, also called a fever blister, is a skin infection that is caused by a virus. This infection causes small, fluid-filled sores to form inside of the mouth or on the lips, gums, nose, chin, or cheeks. Cold sores can spread to other parts of the body, such as the eyes or fingers. Cold sores can be spread or passed from person to person (contagious) until the sores crust over completely. Cold sores can be spread through close contact, such as kissing or sharing a drinking glass. Follow these instructions at home: Medicines  Take or apply over-the-counter and prescription medicines only as told by your doctor.  Use a cotton-tip swab to apply creams or gels to your sores. Sore Care  Do not touch the sores or pick the scabs.  Wash your hands often. Do not touch your eyes without washing your hands first.  Keep the sores clean and dry.  If directed, apply ice to the sores:  Put ice in a plastic bag.  Place a towel between your skin and the bag.  Leave the ice on for 20 minutes, 2-3 times per day. Lifestyle  Do not kiss, have oral sex, or share personal items until your sores heal.  Eat a soft, bland diet. Avoid eating hot, cold, or salty foods. These can hurt your mouth.  Use a straw if it hurts to drink out of a glass.  Avoid the sun and limit your stress if these things trigger outbreaks. If sun causes cold sores, apply sunscreen on your lips before being out in the sun. Contact a doctor if:  You have symptoms for more than two weeks.  You have pus coming from the sores.  You have redness that is spreading.  You have pain or irritation in your eye.  You get sores on your genitals.  Your sores do not heal within two weeks.  You get cold sores often. Get help right away if:  You have a fever and your symptoms suddenly get worse.  You have a headache and confusion. This information is not intended to replace advice given to you by your health care  provider. Make sure you discuss any questions you have with your health care provider. Document Released: 03/16/2012 Document Revised: 02/21/2016 Document Reviewed: 07/06/2015 Elsevier Interactive Patient Education  2018 [employer]  

## 2017-07-30 DIAGNOSIS — B001 Herpesviral vesicular dermatitis: Secondary | ICD-10-CM | POA: Insufficient documentation

## 2017-07-30 NOTE — Progress Notes (Signed)
   Subjective:    Patient ID: Breanna Flynn, female    DOB: 1970-12-17, 46 y.o.   MRN: [id number]  HPI Pt is a 46 yo pleasant female with hx of cold sores who presents to the clinic for cold sore flare. She ran out of her antiviral for outbreak and was told to come in. Burning and tingling of upper lip started 2 days ago. Vesicle popped up this am. No fever, chills. She has approximately 2-3 a year.   .. Active Ambulatory Problems    Diagnosis Date Noted  . GERD 01/07/2010  . 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 ACNE, CYSTIC 11/29/2010  . BENIGN NEOPLASM OF OTHER SPECIFIED SITES OF SKIN 05/08/2011  . Cold sore 07/30/2017   Resolved Ambulatory Problems    Diagnosis Date Noted  . No Resolved Ambulatory Problems   Past Medical History:  Diagnosis Date  . GERD (gastroesophageal reflux disease)       Review of Systems  All other systems reviewed and are negative.      Objective:   Physical Exam  Constitutional: She is oriented to person, place, and time. She appears well-developed and well-nourished.  HENT:  Head: Normocephalic and atraumatic.    Neurological: She is alert and oriented to person, place, and time.  Psychiatric: She has a normal mood and affect. Her behavior is normal.          Assessment & Plan:  Marland KitchenMarland KitchenMargarete was seen today for mouth lesions.  Diagnoses and all orders for this visit:  Herpes simplex virus (HSV) infection -     acyclovir (ZOVIRAX) 400 MG tablet; Take 1 tablet (400 mg total) by mouth 3 (three) times daily. For five days during outbreak.  Cold sore -     acyclovir (ZOVIRAX) 400 MG tablet; Take 1 tablet (400 mg total) by mouth 3 (three) times daily. For five days during outbreak.  Other orders -     Discontinue: valACYclovir (VALTREX) 1000 MG tablet; Take 2 tablets by mouth twice a day for one day for outbreaks.   Valtrex was too expensive. Sent acyclovir for outbreaks. HO given. Discussed prevention. If having more frequently  discussed prevention with antiviral.

## 2017-11-25 ENCOUNTER — Ambulatory Visit (INDEPENDENT_AMBULATORY_CARE_PROVIDER_SITE_OTHER): Payer: Managed Care, Other (non HMO) | Admitting: Family Medicine

## 2017-11-25 ENCOUNTER — Encounter: Payer: Self-pay | Admitting: Family Medicine

## 2017-11-25 DIAGNOSIS — M76899 Other specified enthesopathies of unspecified lower limb, excluding foot: Secondary | ICD-10-CM | POA: Insufficient documentation

## 2017-11-25 DIAGNOSIS — S39012A Strain of muscle, fascia and tendon of lower back, initial encounter: Secondary | ICD-10-CM

## 2017-11-25 DIAGNOSIS — M76892 Other specified enthesopathies of left lower limb, excluding foot: Secondary | ICD-10-CM | POA: Diagnosis not present

## 2017-11-25 MED ORDER — CYCLOBENZAPRINE HCL 10 MG PO TABS: 5.0000 mg | ORAL_TABLET | Freq: Three times a day (TID) | ORAL | 0 refills | Status: DC | PRN

## 2017-11-25 MED ORDER — MELOXICAM 15 MG PO TABS: 15.0000 mg | ORAL_TABLET | Freq: Every day | ORAL | 0 refills | Status: DC

## 2017-11-25 NOTE — Progress Notes (Signed)
Breanna Flynn is a 47 y.o. female who presents to [facility] today for acute onset of low back pain and left lateral hip pain.  Breanna Flynn is a healthy 47 year old woman who remains physically fit and active.  She participates in yoga regularly.  She was in her normal state of health last week when she was trying a very difficult yoga poses and felt some twinging in her left lateral hip and low back.  The pain worsened after she finished doing yoga and developed into significant pain in the lateral hip and low back.  She notes over the past few days pretty significant pain in the left low back and lateral hip worse with activity and better with rest.  She also notes the pain is bothersome at bedtime.  She denies any weakness or numbness or bowel bladder dysfunction.  She is tried some leftover meloxicam and Flexeril and found to be moderately helpful.  She also notes a heating pad is very helpful as well.  She denies any injury.   Past Medical History:  Diagnosis Date  . GERD (gastroesophageal reflux disease)    Past Surgical History:  Procedure Laterality Date  . ELA     LT leg  . ENDOVENOUS ABLATION SAPHENOUS VEIN W/ LASER  09-2009  . HERNIA REPAIR  02-2008  . mass removal     RT side   Social History   Tobacco Use  . Smoking status: Never Smoker  . Smokeless tobacco: Never Used  Substance Use Topics  . Alcohol use: Yes     ROS:  As above   Medications: Current Outpatient Medications  Medication Sig Dispense Refill  . cyclobenzaprine (FLEXERIL) 10 MG tablet Take 0.5-1 tablets (5-10 mg total) by mouth 3 (three) times daily as needed for muscle spasms. 30 tablet 0  . meloxicam (MOBIC) 15 MG tablet Take 1 tablet (15 mg total) by mouth daily. 14 tablet 0   No current facility-administered medications for this visit.    Allergies  Allergen Reactions  . Penicillins     REACTION: hives     Exam:  BP 120/80   Pulse 82   Ht 5\' 7"   (1.702 m)   Wt 143 lb (64.9 kg)   BMI 22.40 kg/m  General: Well Developed, well nourished, and in no acute distress.  Neuro/Psych: Alert and oriented x3, extra-ocular muscles intact, able to move all 4 extremities, sensation grossly intact. Skin: Warm and dry, no rashes noted.  Respiratory: Not using accessory muscles, speaking in full sentences, trachea midline.  Cardiovascular: Pulses palpable, no extremity edema. Abdomen: Does not appear distended. MSK:  L-spine: Nontender to spinal midline.  Tender palpation left lower lumbar paraspinal muscle group. Range of motion stiff and limited especially with extension and lateral rotation and lateral flexion.  Right lateral flexion worsens her pain. Negative slump test bilaterally. Lower extremity strength is equal and intact throughout. Reflexes are equal and normal bilateral lower extremities.  Left hip: Normal-appearing Normal range of motion. Tender to palpation at the piriformis and gluteus medius muscle bellies in the buttocks Pain is reproduced with FADIR stretch Significant pain and weakness with hip abduction strength testing 3/5     Assessment and Plan: 47 y.o. female with left low back pain and left lateral hip pain are very likely muscle spasms due to strain of the hip abductors and low back musculature.  Fortunately she does not have any signs or symptoms concerning for significant nerve injury.  Plan for physical therapy heating pad TENS unit home exercise program and stretching as well as meloxicam and Flexeril.  Recommend Zantac or Pepcid for GI prophylaxis while taking meloxicam. Follow-up in a few weeks if not better.  I discussed Tdap vaccine patient is thinking about it. She also will plan on getting a mammogram in the near future as well.    Orders Placed This Encounter  Procedures  . Ambulatory referral to Physical Therapy    Referral Priority:   Routine    Referral Type:   Physical Medicine    Referral  Reason:   Specialty Services Required    Requested Specialty:   Physical Therapy   Meds ordered this encounter  Medications  . meloxicam (MOBIC) 15 MG tablet    Sig: Take 1 tablet (15 mg total) by mouth daily.    Dispense:  14 tablet    Refill:  0  . cyclobenzaprine (FLEXERIL) 10 MG tablet    Sig: Take 0.5-1 tablets (5-10 mg total) by mouth 3 (three) times daily as needed for muscle spasms.    Dispense:  30 tablet    Refill:  0    Discussed warning signs or symptoms. Please see discharge instructions. Patient expresses understanding.

## 2017-11-25 NOTE — Patient Instructions (Signed)
Thank you for coming in today. I think you have a low back strain.  I also think you strained and have spasm of the hip abductors  (Glueteus Medius and Piriformis).  Take meloxicam as needed daily.  Use cyclobenzaprine at bedtime as needed.  Use heating pad and TENS unit.  Recheck if not better in 4 weeks.  Attend PT.  Return sooner if needed or if worse.   Come back or go to the emergency room if you notice new weakness new numbness problems walking or bowel or bladder problems.  Trochanteric Bursitis Trochanteric bursitis is a condition that causes hip pain. Trochanteric bursitis happens when fluid-filled sacs (bursae) in the hip get irritated. Normally these sacs absorb shock and help strong bands of tissue (tendons) in your hip glide smoothly over each other and over your hip bones. What are the causes? This condition results from increased friction between the hip bones and the tendons that go over them. This condition can happen if you:  Have weak hips.  Use your hip muscles too much (overuse).  Get hit in the hip.  What increases the risk? This condition is more likely to develop in:  Women.  Adults who are middle-aged or older.  People with arthritis or a spinal condition.  People with weak buttocks muscles (gluteal muscles).  People who have one leg that is shorter than the other.  People who participate in certain kinds of athletic activities, such as: ? Running sports, especially long-distance running. ? Contact sports, like football or martial arts. ? Sports in which falls may occur, like skiing.  What are the signs or symptoms? The main symptom of this condition is pain and tenderness over the point of your hip. The pain may be:  Sharp and intense.  Dull and achy.  Felt on the outside of your thigh.  It may increase when you:  Lie on your side.  Walk or run.  Go up on stairs.  Sit.  Stand up after sitting.  Stand for long periods of  time.  How is this diagnosed? This condition may be diagnosed based on:  Your symptoms.  Your medical history.  A physical exam.  Imaging tests, such as: ? X-rays to check your bones. ? An MRI or ultrasound to check your tendons and muscles.  During your physical exam, your health care provider will check the movement and strength of your hip. He or she may press on the point of your hip to check for pain. How is this treated? This condition may be treated by:  Resting.  Reducing your activity.  Avoiding activities that cause pain.  Using crutches, a cane, or a walker to decrease the strain on your hip.  Taking medicine to help with swelling.  Having medicine injected into the bursae to help with swelling.  Using ice, heat, and massage therapy for pain relief.  Physical therapy exercises for strength and flexibility.  Surgery (rare).  Follow these instructions at home: Activity  Rest.  Avoid activities that cause pain.  Return to your normal activities as told by your health care provider. Ask your health care provider what activities are safe for you. Managing pain, stiffness, and swelling  Take over-the-counter and prescription medicines only as told by your health care provider.  If directed, apply heat to the injured area as told by your health care provider. ? Place a towel between your skin and the heat source. ? Leave the heat on for 20-30 minutes. ?  Remove the heat if your skin turns bright red. This is especially important if you are unable to feel pain, heat, or cold. You may have a greater risk of getting burned.  If directed, apply ice to the injured area: ? Put ice in a plastic bag. ? Place a towel between your skin and the bag. ? Leave the ice on for 20 minutes, 2-3 times a day. General instructions  If the affected leg is one that you use for driving, ask your health care provider when it is safe to drive.  Use crutches, a cane, or a walker  as told by your health care provider.  If one of your legs is shorter than the other, get fitted for a shoe insert.  Lose weight if you are overweight. How is this prevented?  Wear supportive footwear that is appropriate for your sport.  If you have hip pain, start any new exercise or sport slowly.  Maintain physical fitness, including: ? Strength. ? Flexibility. Contact a health care provider if:  Your pain does not improve with 2-4 weeks. Get help right away if:  You develop severe pain.  You have a fever.  You develop increased redness over your hip.  You have a change in your bowel function or bladder function.  You cannot control the muscles in your feet. This information is not intended to replace advice given to you by your health care provider. Make sure you discuss any questions you have with your health care provider. Document Released: 10/23/2004 Document Revised: 05/21/2016 Document Reviewed: 08/31/2015 Elsevier Interactive Patient Education  2018 [employer].   Lumbosacral Strain Lumbosacral strain is an injury that causes pain in the lower back (lumbosacral spine). This injury usually occurs from overstretching the muscles or ligaments along your spine. A strain can affect one or more muscles or cord-like tissues that connect bones to other bones (ligaments). What are the causes? This condition may be caused by:  A hard, direct hit (blow) to the back.  Excessive stretching of the lower back muscles. This may result from: ? A fall. ? Lifting something heavy. ? Repetitive movements such as bending or crouching.  What increases the risk? The following factors may increase your risk of getting this condition:  Participating in sports or activities that involve: ? A sudden twist of the back. ? Pushing or pulling motions.  Being overweight or obese.  Having poor strength and flexibility, especially tight hamstrings or weak muscles in the back or  abdomen.  Having too much of a curve in the lower back.  Having a pelvis that is tilted forward.  What are the signs or symptoms? The main symptom of this condition is pain in the lower back, at the site of the strain. Pain may extend (radiate) down one or both legs. How is this diagnosed? This condition is diagnosed based on:  Your symptoms.  Your medical history.  A physical exam. ? Your health care provider may push on certain areas of your back to determine the source of your pain. ? You may be asked to bend forward, backward, and side to side to assess the severity of your pain and your range of motion.  Imaging tests, such as: ? X-rays. ? MRI.  How is this treated? Treatment for this condition may include:  Putting heat and cold on the affected area.  Medicines to help relieve pain and relax your muscles (muscle relaxants).  NSAIDs to help reduce swelling and discomfort.  When  your symptoms improve, it is important to gradually return to your normal routine as soon as possible to reduce pain, avoid stiffness, and avoid loss of muscle strength. Generally, symptoms should improve within 6 weeks of treatment. However, recovery time varies. Follow these instructions at home: Managing pain, stiffness, and swelling   If directed, put ice on the injured area during the first 24 hours after your strain. ? Put ice in a plastic bag. ? Place a towel between your skin and the bag. ? Leave the ice on for 20 minutes, 2-3 times a day.  If directed, put heat on the affected area as often as told by your health care provider. Use the heat source that your health care provider recommends, such as a moist heat pack or a heating pad. ? Place a towel between your skin and the heat source. ? Leave the heat on for 20-30 minutes. ? Remove the heat if your skin turns bright red. This is especially important if you are unable to feel pain, heat, or cold. You may have a greater risk of  getting burned. Activity  Rest and return to your normal activities as told by your health care provider. Ask your health care provider what activities are safe for you.  Avoid activities that take a lot of energy for as long as told by your health care provider. General instructions  Take over-the-counter and prescription medicines only as told by your health care provider.  Donot drive or use heavy machinery while taking prescription pain medicine.  Do not use any products that contain nicotine or tobacco, such as cigarettes and e-cigarettes. If you need help quitting, ask your health care provider.  Keep all follow-up visits as told by your health care provider. This is important. How is this prevented?  Use correct form when playing sports and lifting heavy objects.  Use good posture when sitting and standing.  Maintain a healthy weight.  Sleep on a mattress with medium firmness to support your back.  Be safe and responsible while being active to avoid falls.  Do at least 150 minutes of moderate-intensity exercise each week, such as brisk walking or water aerobics. Try a form of exercise that takes stress off your back, such as swimming or stationary cycling.  Maintain physical fitness, including: ? Strength. ? Flexibility. ? Cardiovascular fitness. ? Endurance. Contact a health care provider if:  Your back pain does not improve after 6 weeks of treatment.  Your symptoms get worse. Get help right away if:  Your back pain is severe.  You cannot stand or walk.  You have difficulty controlling when you urinate or when you have a bowel movement.  You feel nauseous or you vomit.  Your feet get very cold.  You have numbness, tingling, weakness, or problems using your arms or legs.  You develop any of the following: ? Shortness of breath. ? Dizziness. ? Pain in your legs. ? Weakness in your buttocks or legs. ? Discoloration of the skin on your toes or  legs. This information is not intended to replace advice given to you by your health care provider. Make sure you discuss any questions you have with your health care provider. Document Released: 06/25/2005 Document Revised: 04/04/2016 Document Reviewed: 02/17/2016 Elsevier Interactive Patient Education  [employer].

## 2017-11-30 ENCOUNTER — Ambulatory Visit: Payer: Managed Care, Other (non HMO) | Admitting: Physical Therapy

## 2018-05-17 ENCOUNTER — Encounter: Payer: Self-pay | Admitting: Family Medicine

## 2018-05-17 ENCOUNTER — Ambulatory Visit: Payer: Managed Care, Other (non HMO) | Admitting: Family Medicine

## 2018-05-17 VITALS — BP 121/65 | HR 90 | Ht 66.0 in | Wt 145.0 lb

## 2018-05-17 DIAGNOSIS — N3000 Acute cystitis without hematuria: Secondary | ICD-10-CM

## 2018-05-17 DIAGNOSIS — Z1239 Encounter for other screening for malignant neoplasm of breast: Secondary | ICD-10-CM

## 2018-05-17 DIAGNOSIS — Z1231 Encounter for screening mammogram for malignant neoplasm of breast: Secondary | ICD-10-CM

## 2018-05-17 DIAGNOSIS — R300 Dysuria: Secondary | ICD-10-CM | POA: Diagnosis not present

## 2018-05-17 DIAGNOSIS — B002 Herpesviral gingivostomatitis and pharyngotonsillitis: Secondary | ICD-10-CM | POA: Diagnosis not present

## 2018-05-17 LAB — POCT URINALYSIS DIPSTICK
Bilirubin, UA: NEGATIVE
Glucose, UA: NEGATIVE
KETONES UA: NEGATIVE
LEUKOCYTES UA: NEGATIVE
Nitrite, UA: NEGATIVE
Protein, UA: NEGATIVE
RBC UA: NEGATIVE
Urobilinogen, UA: 0.2 E.U./dL
pH, UA: 5.5 (ref 5.0–8.0)

## 2018-05-17 LAB — HM MAMMOGRAPHY

## 2018-05-17 MED ORDER — ACYCLOVIR 400 MG PO TABS: 400.0000 mg | ORAL_TABLET | Freq: Three times a day (TID) | ORAL | 11 refills | Status: DC

## 2018-05-17 MED ORDER — SULFAMETHOXAZOLE-TRIMETHOPRIM 800-160 MG PO TABS: 1.0000 | ORAL_TABLET | Freq: Two times a day (BID) | ORAL | 0 refills | Status: DC

## 2018-05-17 NOTE — Progress Notes (Signed)
Subjective:    Patient ID: Breanna Flynn, female    DOB: [birth year], 47 y.o.   MRN: [id number]  HPI  47 year old female comes in complaining of 2 days of dysuria and frequency she thought initially she had just worked out too much and worked her abdominal muscles to much but then realized that it was actually a urinary tract infection.  She denies any fevers, chills, sweats.  She denies any blood in the urine .  At home she has been taking ibuprofen and Azo for pain relief and did actually have some old doxycycline which she started as well.   She would like to have a refill on acyclovir.  She was given a prescription actually by her eye doctor for 4 mg 3 times daily x5 days as needed for outbreak of oral herpes.  She would like a refill sent to the pharmacy.  Review of Systems  BP 121/65   Pulse 90   Ht 5\' 6"  (1.676 m)   Wt 145 lb (65.8 kg)   SpO2 (!) 66%   BMI 23.40 kg/m     Allergies  Allergen Reactions  . Penicillins     REACTION: hives    Past Medical History:  Diagnosis Date  . GERD (gastroesophageal reflux disease)     Past Surgical History:  Procedure Laterality Date  . ELA     LT leg  . ENDOVENOUS ABLATION SAPHENOUS VEIN W/ LASER  09-2009  . HERNIA REPAIR  02-2008  . mass removal     RT side    Social History   Socioeconomic History  . Marital status: Married    Spouse name: Not on file  . Number of children: Not on file  . Years of education: Not on file  . Highest education level: Not on file  Occupational History  . Not on file  Social Needs  . Financial resource strain: Not on file  . Food insecurity:    Worry: Not on file    Inability: Not on file  . Transportation needs:    Medical: Not on file    Non-medical: Not on file  Tobacco Use  . Smoking status: Never Smoker  . Smokeless tobacco: Never Used  Substance and Sexual Activity  . Alcohol use: Yes  . Drug use: No  . Sexual activity: Yes    Comment: Preschool asst, BS accounting ,  married, 2 kids, regularly exercises, 3 caffeine drinks daily.  Lifestyle  . Physical activity:    Days per week: Not on file    Minutes per session: Not on file  . Stress: Not on file  Relationships  . Social connections:    Talks on phone: Not on file    Gets together: Not on file    Attends religious service: Not on file    Active member of club or organization: Not on file    Attends meetings of clubs or organizations: Not on file    Relationship status: Not on file  . Intimate partner violence:    Fear of current or ex partner: Not on file    Emotionally abused: Not on file    Physically abused: Not on file    Forced sexual activity: Not on file  Other Topics Concern  . Not on file  Social History Narrative  . Not on file    Family History  Problem Relation Age of Onset  . Diabetes Mother   . Hypertension Mother   . Diabetes Father   .  Hypertension Father   . Hyperlipidemia Unknown        family history  . Heart attack Sister 52    Outpatient Encounter Medications as of 05/17/2018  Medication Sig  . acyclovir (ZOVIRAX) 400 MG tablet Take 1 tablet (400 mg total) by mouth 3 (three) times daily. X 5 day prn outbreak  . sulfamethoxazole-trimethoprim (BACTRIM DS,SEPTRA DS) 800-160 MG tablet Take 1 tablet by mouth 2 (two) times daily.  . [DISCONTINUED] cyclobenzaprine (FLEXERIL) 10 MG tablet Take 0.5-1 tablets (5-10 mg total) by mouth 3 (three) times daily as needed for muscle spasms.  . [DISCONTINUED] meloxicam (MOBIC) 15 MG tablet Take 1 tablet (15 mg total) by mouth daily.   No facility-administered encounter medications on file as of 05/17/2018.          Objective:   Physical Exam  Constitutional: She is oriented to person, place, and time. She appears well-developed and well-nourished.  HENT:  Head: Normocephalic and atraumatic.  Cardiovascular: Normal rate, regular rhythm and normal heart sounds.  Pulmonary/Chest: Effort normal and breath sounds normal.   Musculoskeletal:  No CVA tenderness.  Neurological: She is alert and oriented to person, place, and time.  Skin: Skin is warm and dry.  Psychiatric: She has a normal mood and affect. Her behavior is normal.          Assessment & Plan:  Urinary tract infection-would treat based on symptoms.  We will go ahead and send over prescription for Bactrim.  UA was negative but she is also been taking doxycycline which likely has affected the urine sample.  Okay to continue Azo if needed.  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  I did encourage her to schedule her mammogram as she is overdue.  She is up-to-date on her Pap smear would be happy to see her back in the next month for a full physical.

## 2018-05-25 ENCOUNTER — Telehealth: Payer: Self-pay | Admitting: *Deleted

## 2018-05-25 NOTE — Telephone Encounter (Signed)
Order for mammogram signed, faxed, confirmation received and scanned into chart.Breanna Flynn, Breanna Flynn

## 2018-05-26 LAB — HM MAMMOGRAPHY

## 2018-06-03 ENCOUNTER — Encounter: Payer: Self-pay | Admitting: Family Medicine

## 2018-06-14 ENCOUNTER — Encounter: Payer: Managed Care, Other (non HMO) | Admitting: Family Medicine

## 2019-07-12 ENCOUNTER — Other Ambulatory Visit: Payer: Self-pay | Admitting: Family Medicine

## 2019-07-12 DIAGNOSIS — Z1239 Encounter for other screening for malignant neoplasm of breast: Secondary | ICD-10-CM

## 2021-02-12 ENCOUNTER — Encounter: Payer: Self-pay | Admitting: Family Medicine

## 2021-02-12 ENCOUNTER — Other Ambulatory Visit: Payer: Self-pay

## 2021-02-12 ENCOUNTER — Ambulatory Visit: Payer: Managed Care, Other (non HMO) | Admitting: Family Medicine

## 2021-02-12 VITALS — BP 128/79 | HR 73 | Temp 97.9°F | Resp 17

## 2021-02-12 DIAGNOSIS — Z0000 Encounter for general adult medical examination without abnormal findings: Secondary | ICD-10-CM | POA: Diagnosis not present

## 2021-02-12 DIAGNOSIS — B009 Herpesviral infection, unspecified: Secondary | ICD-10-CM

## 2021-02-12 DIAGNOSIS — B001 Herpesviral vesicular dermatitis: Secondary | ICD-10-CM | POA: Diagnosis not present

## 2021-02-12 MED ORDER — XERESE 5-1 % EX CREA: 1.0000 "application " | TOPICAL_CREAM | Freq: Every day | CUTANEOUS | 2 refills | Status: AC

## 2021-02-12 MED ORDER — ACYCLOVIR 400 MG PO TABS: 400.0000 mg | ORAL_TABLET | Freq: Three times a day (TID) | ORAL | 11 refills | Status: DC

## 2021-02-12 NOTE — Progress Notes (Signed)
Acute Office Visit  Subjective:    Patient ID: Breanna Flynn, female    DOB: [birth year], 50 y.o.   MRN: [id number]  Chief Complaint  Patient presents with  . Mouth Lesions    HPI Patient is in today for cold sores.  Yesterday, patient noticed some tingling and blister formation of left upper lip. She has a history of HSV cold sores. She has had recurrent episodes for the past 3 months, typically around the start of her period. She previously had refills on acyclovir for flares, but is out now. Would also like to get a written script for the Xerese topical cream her dentist gave her for cold sores as this helps tremendously with the discomfort. She denies any other rashes or systemic symptoms.    Patient would also like me to go ahead and order future basic labs as she plans to schedule an upcoming physical with PCP. No other acute concerns today.     Past Medical History:  Diagnosis Date  . GERD (gastroesophageal reflux disease)     Past Surgical History:  Procedure Laterality Date  . ELA     LT leg  . ENDOVENOUS ABLATION SAPHENOUS VEIN W/ LASER  09-2009  . HERNIA REPAIR  02-2008  . mass removal     RT side    Family History  Problem Relation Age of Onset  . Diabetes Mother   . Hypertension Mother   . Diabetes Father   . Hypertension Father   . Hyperlipidemia Other        family history  . Heart attack Sister 10    Social History   Socioeconomic History  . Marital status: Married    Spouse name: Not on file  . Number of children: Not on file  . Years of education: Not on file  . Highest education level: Not on file  Occupational History  . Not on file  Tobacco Use  . Smoking status: Never Smoker  . Smokeless tobacco: Never Used  Substance and Sexual Activity  . Alcohol use: Yes  . Drug use: No  . Sexual activity: Yes    Comment: Preschool asst, BS accounting , married, 2 kids, regularly exercises, 3 caffeine drinks daily.  Other Topics Concern  . Not  on file  Social History Narrative  . Not on file   Social Determinants of Health   Financial Resource Strain: Not on file  Food Insecurity: Not on file  Transportation Needs: Not on file  Physical Activity: Not on file  Stress: Not on file  Social Connections: Not on file  Intimate Partner Violence: Not on file    Outpatient Medications Prior to Visit  Medication Sig Dispense Refill  . sulfamethoxazole-trimethoprim (BACTRIM DS,SEPTRA DS) 800-160 MG tablet Take 1 tablet by mouth 2 (two) times daily. 6 tablet 0  . acyclovir (ZOVIRAX) 400 MG tablet Take 1 tablet (400 mg total) by mouth 3 (three) times daily. X 5 day prn outbreak 30 tablet 11   No facility-administered medications prior to visit.    Allergies  Allergen Reactions  . Penicillins     REACTION: hives    Review of Systems All review of systems negative except what is listed in the HPI     Objective:    Physical Exam Vitals reviewed.  Constitutional:      Appearance: Normal appearance.  HENT:     Head: Normocephalic and atraumatic.     Mouth/Throat:     Mouth: Mucous membranes are  moist.     Pharynx: Oropharynx is clear.   Skin:    General: Skin is warm and dry.  Neurological:     General: No focal deficit present.     Mental Status: She is alert and oriented to person, place, and time.  Psychiatric:        Mood and Affect: Mood normal.        Behavior: Behavior normal.        Thought Content: Thought content normal.        Judgment: Judgment normal.     BP 128/79   Pulse 73   Temp 97.9 F (36.6 C)   Resp 17   SpO2 97%  Wt Readings from Last 3 Encounters:  05/17/18 145 lb (65.8 kg)  11/25/17 143 lb (64.9 kg)  07/29/17 143 lb (64.9 kg)    Health Maintenance Due  Topic Date Due  . COVID-19 Vaccine (1) Never done  . HIV Screening  Never done  . Hepatitis C Screening  Never done  . TETANUS/TDAP  Never done  . COLONOSCOPY (Pts 45-40yrs Insurance coverage will need to be confirmed)  Never  done  . MAMMOGRAM  11/26/2018  . PAP SMEAR-Modifier  04/30/2020    There are no preventive care reminders to display for this patient.   Lab Results  Component Value Date   TSH 1.520 08/03/2012   Lab Results  Component Value Date   WBC 6.4 06/27/2011   HGB 13.0 06/27/2011   HCT 38.4 06/27/2011   MCV 93.7 06/27/2011   PLT 212 06/27/2011   Lab Results  Component Value Date   NA 141 08/04/2012   K 4.2 08/04/2012   CO2 26 08/04/2012   GLUCOSE 61 (L) 08/04/2012   BUN 12 08/04/2012   CREATININE 0.79 08/04/2012   BILITOT 0.7 08/04/2012   ALKPHOS 55 08/04/2012   AST 15 08/04/2012   ALT 9 08/04/2012   PROT 6.4 08/04/2012   ALBUMIN 4.0 08/04/2012   CALCIUM 8.7 08/04/2012   Lab Results  Component Value Date   CHOL 119 08/04/2012   Lab Results  Component Value Date   HDL 69 08/04/2012   Lab Results  Component Value Date   LDLCALC 44 08/04/2012   Lab Results  Component Value Date   TRIG 30 08/04/2012   Lab Results  Component Value Date   CHOLHDL 1.7 08/04/2012   No results found for: HGBA1C     Assessment & Plan:   1. Herpes simplex virus (HSV) infection 2. Cold sore  Recurrent cold sores that typically respond to acyclovir tabs and Xerese cream. Prescribing both today. Education provided. Patient aware of signs and symptoms requiring further evaluation. No other acute concerns/complaints today. Follow-up if not improving.   - acyclovir (ZOVIRAX) 400 MG tablet; Take 1 tablet (400 mg total) by mouth 3 (three) times daily. X 5 day prn outbreak  Dispense: 30 tablet; Refill: 11 - Acyclovir-Hydrocortisone (XERESE) 5-1 % CREA; Apply 1 application topically 5 (five) times daily for 5 days.  Dispense: 5 g; Refill: 2   3. Wellness examination Patient to schedule wellness exam/CPE and have labs drawn 1-2 weeks prior to visit.   - CBC with Differential; Future - COMPLETE METABOLIC PANEL WITH GFR; Future - TSH; Future - Lipid panel; Future  Follow-up with PCP for  physical or sooner if symptoms worsen or fail to improve.   Terrilyn Saver, NP

## 2021-03-04 ENCOUNTER — Encounter: Payer: Managed Care, Other (non HMO) | Admitting: Family Medicine

## 2021-07-09 ENCOUNTER — Other Ambulatory Visit: Payer: Self-pay

## 2021-07-09 DIAGNOSIS — Z0000 Encounter for general adult medical examination without abnormal findings: Secondary | ICD-10-CM

## 2021-07-10 LAB — TSH: TSH: 3.3 mIU/L

## 2021-07-10 LAB — CBC WITH DIFFERENTIAL/PLATELET
Absolute Monocytes: 405 cells/uL (ref 200–950)
Basophils Absolute: 18 cells/uL (ref 0–200)
Basophils Relative: 0.4 %
Eosinophils Absolute: 110 cells/uL (ref 15–500)
Eosinophils Relative: 2.4 %
HCT: 41 % (ref 35.0–45.0)
Hemoglobin: 13.9 g/dL (ref 11.7–15.5)
Lymphs Abs: 1329 cells/uL (ref 850–3900)
MCH: 32.6 pg (ref 27.0–33.0)
MCHC: 33.9 g/dL (ref 32.0–36.0)
MCV: 96 fL (ref 80.0–100.0)
MPV: 10.2 fL (ref 7.5–12.5)
Monocytes Relative: 8.8 %
Neutro Abs: 2737 cells/uL (ref 1500–7800)
Neutrophils Relative %: 59.5 %
Platelets: 229 10*3/uL (ref 140–400)
RBC: 4.27 10*6/uL (ref 3.80–5.10)
RDW: 12.4 % (ref 11.0–15.0)
Total Lymphocyte: 28.9 %
WBC: 4.6 10*3/uL (ref 3.8–10.8)

## 2021-07-10 LAB — COMPLETE METABOLIC PANEL WITH GFR
AG Ratio: 1.7 (calc) (ref 1.0–2.5)
ALT: 13 U/L (ref 6–29)
AST: 18 U/L (ref 10–35)
Albumin: 4 g/dL (ref 3.6–5.1)
Alkaline phosphatase (APISO): 60 U/L (ref 31–125)
BUN: 12 mg/dL (ref 7–25)
CO2: 28 mmol/L (ref 20–32)
Calcium: 8.8 mg/dL (ref 8.6–10.2)
Chloride: 105 mmol/L (ref 98–110)
Creat: 0.79 mg/dL (ref 0.50–0.99)
Globulin: 2.3 g/dL (calc) (ref 1.9–3.7)
Glucose, Bld: 88 mg/dL (ref 65–99)
Potassium: 4.3 mmol/L (ref 3.5–5.3)
Sodium: 140 mmol/L (ref 135–146)
Total Bilirubin: 0.5 mg/dL (ref 0.2–1.2)
Total Protein: 6.3 g/dL (ref 6.1–8.1)
eGFR: 92 mL/min/{1.73_m2} (ref >=60)

## 2021-07-10 LAB — LIPID PANEL
Cholesterol: 126 mg/dL (ref <200)
HDL: 66 mg/dL (ref >=50)
LDL Cholesterol (Calc): 51 mg/dL (calc)
Non-HDL Cholesterol (Calc): 60 mg/dL (calc) (ref <130)
Total CHOL/HDL Ratio: 1.9 (calc) (ref <5.0)
Triglycerides: 32 mg/dL (ref <150)

## 2021-07-16 ENCOUNTER — Ambulatory Visit (INDEPENDENT_AMBULATORY_CARE_PROVIDER_SITE_OTHER): Payer: Managed Care, Other (non HMO) | Admitting: Family Medicine

## 2021-07-16 ENCOUNTER — Encounter: Payer: Self-pay | Admitting: Family Medicine

## 2021-07-16 ENCOUNTER — Other Ambulatory Visit: Payer: Self-pay

## 2021-07-16 ENCOUNTER — Other Ambulatory Visit (HOSPITAL_COMMUNITY)
Admission: RE | Admit: 2021-07-16 | Discharge: 2021-07-16 | Disposition: A | Discharge status: Home / Self Care | Payer: Managed Care, Other (non HMO) | Source: Ambulatory Visit | Attending: Family Medicine | Admitting: Family Medicine

## 2021-07-16 VITALS — BP 116/61 | HR 73 | Ht 66.0 in | Wt 152.0 lb

## 2021-07-16 DIAGNOSIS — Z1231 Encounter for screening mammogram for malignant neoplasm of breast: Secondary | ICD-10-CM

## 2021-07-16 DIAGNOSIS — I83813 Varicose veins of bilateral lower extremities with pain: Secondary | ICD-10-CM | POA: Diagnosis not present

## 2021-07-16 DIAGNOSIS — Z124 Encounter for screening for malignant neoplasm of cervix: Secondary | ICD-10-CM | POA: Diagnosis present

## 2021-07-16 NOTE — Progress Notes (Signed)
Subjective:     Breanna Flynn is a 50 y.o. female and is here for a comprehensive physical exam. The patient reports no problems.  She is due for Pap smear and mammogram she usually gets her mammograms done at [facility].  She had her lab work done already.  He would like to go over that today.  Social History   Socioeconomic History   Marital status: Married    Spouse name: Not on file   Number of children: Not on file   Years of education: Not on file   Highest education level: Not on file  Occupational History   Not on file  Tobacco Use   Smoking status: Never   Smokeless tobacco: Never  Substance and Sexual Activity   Alcohol use: Yes   Drug use: No   Sexual activity: Yes    Comment: Preschool asst, BS accounting , married, 2 kids, regularly exercises, 3 caffeine drinks daily.  Other Topics Concern   Not on file  Social History Narrative   Not on file   Social Determinants of Health   Financial Resource Strain: Not on file  Food Insecurity: Not on file  Transportation Needs: Not on file  Physical Activity: Not on file  Stress: Not on file  Social Connections: Not on file  Intimate Partner Violence: Not on file   Health Maintenance  Topic Date Due   COVID-19 Vaccine (1) Never done   HIV Screening  Never done   Hepatitis C Screening  Never done   TETANUS/TDAP  Never done   COLONOSCOPY (Pts 45-66yrs Insurance coverage will need to be confirmed)  Never done   MAMMOGRAM  11/26/2018   PAP SMEAR-Modifier  04/30/2020   INFLUENZA VACCINE  12/27/2021 (Originally 04/29/2021)   HPV VACCINES  Aged Out    The following portions of the patient's history were reviewed and updated as appropriate: allergies, current medications, past family history, past medical history, past social history, past surgical history, and problem list.  Review of Systems Pertinent items are noted in HPI.   Objective:    BP 116/61   Pulse 73   Ht 5\' 6"  (1.676 m)   Wt 152 lb (68.9 kg)   SpO2  100%   BMI 24.53 kg/m  General appearance: alert, cooperative, and appears stated age Head: Normocephalic, without obvious abnormality, atraumatic Eyes:  conj clear, EOMi, PEERLA Ears: normal TM's and external ear canals both ears Nose: Nares normal. Septum midline. Mucosa normal. No drainage or sinus tenderness. Throat: lips, mucosa, and tongue normal; teeth and gums normal Neck: no adenopathy, no carotid bruit, no JVD, supple, symmetrical, trachea midline, and thyroid not enlarged, symmetric, no tenderness/mass/nodules Back: symmetric, no curvature. ROM normal. No CVA tenderness. Lungs: clear to auscultation bilaterally Breasts: normal appearance, no masses or tenderness Heart: regular rate and rhythm, S1, S2 normal, no murmur, click, rub or gallop Abdomen: soft, non-tender; bowel sounds normal; no masses,  no organomegaly Pelvic: cervix normal in appearance, external genitalia normal, no adnexal masses or tenderness, no cervical motion tenderness, rectovaginal septum normal, uterus normal size, shape, and consistency, and vagina normal without discharge Extremities: extremities normal, atraumatic, no cyanosis or edema Pulses: 2+ and symmetric Skin: Skin color, texture, turgor normal. No rashes or lesions Lymph nodes: Cervical, supraclavicular, and axillary nodes normal. Neurologic: Grossly normal    Assessment:    Healthy female exam.      Plan:     See After Visit Summary for Counseling Recommendations  Keep up  a regular exercise program and make sure you are eating a healthy diet Try to eat 4 servings of dairy a day, or if you are lactose intolerant take a calcium with vitamin D daily.  Your vaccines are up to date.  Encouraged her to consider colon ca screening.  Discussed options she will think about it and let me know. Encouraged her to schedule her Tdap she is due we did not find an old one on file for her.  Varicose veins-she does start to get some discomfort in her  veins and sometimes will notice bulging especially with exercise we discussed referral to vascular surgeon if she would like also recommend compression stockings.  He also had a mole on her right inner thigh she would me to take a look at today.  No worrisome features.  We also went over her labs together today and answered questions that she had about her lab work.  She also has a little nodule in the end of the skin on the right side of the torso and axillary line.  She said she actually had it removed years ago but it eventually came back its not painful or bothersome and it has not been getting larger.  She just wants to know if it is okay.  It looks like an epidermal cyst.  As long as its not becoming painful or larger than I think it is okay to just leave it.

## 2021-07-18 LAB — CYTOLOGY - PAP
Comment: NEGATIVE
Diagnosis: NEGATIVE
High risk HPV: NEGATIVE

## 2021-07-19 NOTE — Progress Notes (Signed)
Your Pap smear is normal. You are negative for HPV as well. Repeat pap smear in 5 years.

## 2021-10-23 LAB — HM MAMMOGRAPHY

## 2021-12-09 ENCOUNTER — Ambulatory Visit (INDEPENDENT_AMBULATORY_CARE_PROVIDER_SITE_OTHER): Payer: Managed Care, Other (non HMO) | Admitting: Family Medicine

## 2021-12-09 ENCOUNTER — Other Ambulatory Visit: Payer: Self-pay

## 2021-12-09 VITALS — BP 115/69 | HR 80

## 2021-12-09 DIAGNOSIS — Z111 Encounter for screening for respiratory tuberculosis: Secondary | ICD-10-CM | POA: Diagnosis not present

## 2021-12-09 NOTE — Progress Notes (Signed)
? ?Established Patient Office Visit ? ?Subjective:  ?Patient ID: Breanna Flynn, female    DOB: 07/21/1971  Age: 51 y.o. MRN: [id number] ? ?CC:  ?Chief Complaint  ?Patient presents with  ? PPD Placement  ? ? ?HPI ?[name] presents for PPD placement.  ? ?Past Medical History:  ?Diagnosis Date  ? GERD (gastroesophageal reflux disease)   ? ? ?Past Surgical History:  ?Procedure Laterality Date  ? ELA    ? LT leg  ? ENDOVENOUS ABLATION SAPHENOUS VEIN W/ LASER  09-2009  ? HERNIA REPAIR  02-2008  ? mass removal    ? RT side  ? ? ?Family History  ?Problem Relation Age of Onset  ? Diabetes Mother   ? Hypertension Mother   ? Diabetes Father   ? Hypertension Father   ? Hyperlipidemia Other   ?     family history  ? Heart attack Sister 17  ? ? ?Social History  ? ?Socioeconomic History  ? Marital status: Married  ?  Spouse name: Not on file  ? Number of children: Not on file  ? Years of education: Not on file  ? Highest education level: Not on file  ?Occupational History  ? Not on file  ?Tobacco Use  ? Smoking status: Never  ? Smokeless tobacco: Never  ?Substance and Sexual Activity  ? Alcohol use: Yes  ? Drug use: No  ? Sexual activity: Yes  ?  Comment: Preschool asst, BS accounting , married, 2 kids, regularly exercises, 3 caffeine drinks daily.  ?Other Topics Concern  ? Not on file  ?Social History Narrative  ? Not on file  ? ?Social Determinants of Health  ? ?Financial Resource Strain: Not on file  ?Food Insecurity: Not on file  ?Transportation Needs: Not on file  ?Physical Activity: Not on file  ?Stress: Not on file  ?Social Connections: Not on file  ?Intimate Partner Violence: Not on file  ? ? ?Outpatient Medications Prior to Visit  ?Medication Sig Dispense Refill  ? acyclovir (ZOVIRAX) 400 MG tablet Take 1 tablet (400 mg total) by mouth 3 (three) times daily. X 5 day prn outbreak 30 tablet 11  ? ?No facility-administered medications prior to visit.  ? ? ?Allergies  ?Allergen Reactions  ? Penicillins   ?  REACTION: hives   ? ? ?ROS ?Review of Systems ? ?  ?Objective:  ?  ?Physical Exam ? ?BP 115/69   Pulse 80   SpO2 99%  ?Wt Readings from Last 3 Encounters:  ?07/16/21 152 lb (68.9 kg)  ?05/17/18 145 lb (65.8 kg)  ?11/25/17 143 lb (64.9 kg)  ? ? ? ?Health Maintenance Due  ?Topic Date Due  ? COVID-19 Vaccine (1) Never done  ? HIV Screening  Never done  ? Hepatitis C Screening  Never done  ? TETANUS/TDAP  Never done  ? COLONOSCOPY (Pts 45-70yr Insurance coverage will need to be confirmed)  Never done  ? MAMMOGRAM  11/26/2018  ? ? ?There are no preventive care reminders to display for this patient. ? ?Lab Results  ?Component Value Date  ? TSH 3.30 07/09/2021  ? ?Lab Results  ?Component Value Date  ? WBC 4.6 07/09/2021  ? HGB 13.9 07/09/2021  ? HCT 41.0 07/09/2021  ? MCV 96.0 07/09/2021  ? PLT 229 07/09/2021  ? ?Lab Results  ?Component Value Date  ? NA 140 07/09/2021  ? K 4.3 07/09/2021  ? CO2 28 07/09/2021  ? GLUCOSE 88 07/09/2021  ? BUN 12 07/09/2021  ?  CREATININE 0.79 07/09/2021  ? BILITOT 0.5 07/09/2021  ? ALKPHOS 55 08/04/2012  ? AST 18 07/09/2021  ? ALT 13 07/09/2021  ? PROT 6.3 07/09/2021  ? ALBUMIN 4.0 08/04/2012  ? CALCIUM 8.8 07/09/2021  ? EGFR 92 07/09/2021  ? ?Lab Results  ?Component Value Date  ? CHOL 126 07/09/2021  ? ?Lab Results  ?Component Value Date  ? HDL 66 07/09/2021  ? ?Lab Results  ?Component Value Date  ? [place] 51 07/09/2021  ? ?Lab Results  ?Component Value Date  ? TRIG 32 07/09/2021  ? ?Lab Results  ?Component Value Date  ? CHOLHDL 1.9 07/09/2021  ? ?No results found for: HGBA1C ? ?  ?Assessment & Plan:  ?PPD placement - Patient tolerated injection well without complications. Patient advised to schedule next injection 2 days from today.   ? ?Patient also has a form. Placed in Dr Gardiner Ramus basket.  ? ?Problem List Items Addressed This Visit   ?None ?Visit Diagnoses   ? ? Screening-pulmonary TB    -  Primary  ? Relevant Orders  ? PPD (Completed)  ? ?  ? ? ?No orders of the defined types were placed in this  encounter. ? ? ?Follow-up: Return in about 2 days (around 12/11/2021) for PPD read. .  ? ? ?Breanna Flynn, [place] ?

## 2021-12-09 NOTE — Progress Notes (Signed)
Agree with documentation as above.   Nivin Braniff, MD  

## 2021-12-11 ENCOUNTER — Other Ambulatory Visit: Payer: Self-pay

## 2021-12-11 ENCOUNTER — Ambulatory Visit (INDEPENDENT_AMBULATORY_CARE_PROVIDER_SITE_OTHER): Payer: Managed Care, Other (non HMO) | Admitting: Family Medicine

## 2021-12-11 DIAGNOSIS — Z111 Encounter for screening for respiratory tuberculosis: Secondary | ICD-10-CM

## 2021-12-11 LAB — TB SKIN TEST
Induration: 0 mm
TB Skin Test: NEGATIVE

## 2021-12-11 NOTE — Progress Notes (Signed)
Patient is here for a PPD read. ? ?Results were Negative ?

## 2021-12-12 NOTE — Progress Notes (Signed)
Agree with documentation as above.   Phallon Haydu, MD  

## 2021-12-18 ENCOUNTER — Encounter: Payer: Self-pay | Admitting: Family Medicine

## 2022-01-07 ENCOUNTER — Ambulatory Visit: Payer: Managed Care, Other (non HMO) | Admitting: Medical-Surgical

## 2022-01-07 ENCOUNTER — Encounter: Payer: Self-pay | Admitting: Medical-Surgical

## 2022-01-07 VITALS — BP 104/71 | HR 78 | Temp 98.5°F | Ht 67.0 in | Wt 151.1 lb

## 2022-01-07 DIAGNOSIS — B009 Herpesviral infection, unspecified: Secondary | ICD-10-CM | POA: Diagnosis not present

## 2022-01-07 DIAGNOSIS — J0100 Acute maxillary sinusitis, unspecified: Secondary | ICD-10-CM | POA: Diagnosis not present

## 2022-01-07 MED ORDER — AZITHROMYCIN 250 MG PO TABS: ORAL_TABLET | ORAL | 0 refills | Status: AC

## 2022-01-07 MED ORDER — ACYCLOVIR 400 MG PO TABS: 400.0000 mg | ORAL_TABLET | Freq: Three times a day (TID) | ORAL | 11 refills | Status: DC

## 2022-01-07 NOTE — Progress Notes (Signed)
?  HPI with pertinent ROS:  ? ?CC: sinus symptoms ? ?HPI: ?Pleasant 51 year old female presenting today with 1 week of upper respiratory symptoms including sinus congestion, rhinorrhea, facial pressure cough of dental pain along the top teeth, and a cough productive of light yellow sputum.  Notes her cough is intermittent and is not keeping her awake at night.  She has also had headache and most recently has felt like she has a cold sore starting.  She has been taking guaifenesin, NyQuil, ibuprofen, and an over-the-counter decongestant.  Started acyclovir when her cold sore symptoms began.  She is also been using honey teas, warm showers, and steam baths for her symptoms.  Unfortunately, her symptoms have not improved.  She is not currently taking an antihistamine or using a nasal spray. ? ?I reviewed the past medical history, family history, social history, surgical history, and allergies today and no changes were needed.  Please see the problem list section below in epic for further details. ? ? ?Physical exam:  ? ?General: Well Developed, well nourished, and in no acute distress.  ?Neuro: Alert and oriented x3.  ?HEENT: Normocephalic, atraumatic, pupils equal round reactive to light, neck supple, no masses, no lymphadenopathy, thyroid nonpalpable.  Mild posterior oropharyngeal erythema, no exudate noted.  Bilateral external ear canals patent, TMs with serous effusions bilaterally. ?Skin: Warm and dry. ?Cardiac: Regular rate and rhythm, no murmurs rubs or gallops, no lower extremity edema.  ?Respiratory: Clear to auscultation bilaterally. Not using accessory muscles, speaking in full sentences. ? ?Impression and Recommendations:   ? ?1. Acute non-recurrent maxillary sinusitis ?1 week of symptoms not improved with conservative measures and over-the-counter treatment.  Starting azithromycin 5 mg today with 250 mg daily for the next 4 days.  Continue symptomatic treatment.  If cough becomes more bothersome, advised  her to contact us and we will send in a prescription medication. ? ?2. Herpes simplex virus (HSV) infection ?Continue acyclovir as directed as needed.  Refill sent. ?- acyclovir (ZOVIRAX) 400 MG tablet; Take 1 tablet (400 mg total) by mouth 3 (three) times daily. X 5 day prn outbreak  Dispense: 30 tablet; Refill: 11 ? ?Return if symptoms worsen or fail to improve. ?___________________________________________ ?Clearnce Sorrel, DNP, APRN, FNP-BC ?Primary Care and Sports Medicine ?[place] ?

## 2022-11-11 ENCOUNTER — Ambulatory Visit: Payer: Managed Care, Other (non HMO) | Admitting: Family Medicine

## 2022-11-11 ENCOUNTER — Encounter: Payer: Self-pay | Admitting: Family Medicine

## 2022-11-11 VITALS — BP 136/74 | HR 72 | Ht 67.0 in | Wt 154.1 lb

## 2022-11-11 DIAGNOSIS — J019 Acute sinusitis, unspecified: Secondary | ICD-10-CM | POA: Diagnosis not present

## 2022-11-11 MED ORDER — AZITHROMYCIN 250 MG PO TABS: ORAL_TABLET | ORAL | 0 refills | Status: AC

## 2022-11-11 MED ORDER — HYDROCODONE BIT-HOMATROP MBR 5-1.5 MG/5ML PO SOLN: 5.0000 mL | Freq: Three times a day (TID) | ORAL | 0 refills | Status: DC | PRN

## 2022-11-11 NOTE — Progress Notes (Signed)
   Acute Office Visit  Subjective:     Patient ID: Breanna Flynn, adult    DOB: [birth year], 52 y.o.   MRN: [id number]  Chief Complaint  Patient presents with   teeth sore   Ear Fullness    HPI Patient is in today for 6 days or sinus congestion, cough runny nose, ST and ear fullness bilaterally. Teeth hurting.  Cough has been keeping her awake at night.  Using some over-the-counter cold medications.  No fevers or chills.  Maybe a fever in the beginning.  Did do 2 home COVID test that were negative.  ROS      Objective:    BP 136/74 (BP Location: Right Arm, Patient Position: Sitting, Cuff Size: Large)   Pulse 72   Ht '5\' 7"'$  (1.702 m)   Wt 154 lb 1.3 oz (69.9 kg)   SpO2 99%   BMI 24.13 kg/m    Physical Exam Constitutional:      Appearance: Normal appearance. He is well-developed.  HENT:     Head: Normocephalic and atraumatic.     Right Ear: Tympanic membrane, ear canal and external ear normal.     Left Ear: Tympanic membrane, ear canal and external ear normal.     Nose: Nose normal.     Mouth/Throat:     Pharynx: Oropharynx is clear.     Comments: + cobblestoning Eyes:     Conjunctiva/sclera: Conjunctivae normal.     Pupils: Pupils are equal, round, and reactive to light.  Neck:     Thyroid: No thyromegaly.  Cardiovascular:     Rate and Rhythm: Normal rate and regular rhythm.     Heart sounds: Normal heart sounds.  Pulmonary:     Effort: Pulmonary effort is normal.     Breath sounds: Normal breath sounds. No wheezing.  Musculoskeletal:     Cervical back: Neck supple. No tenderness.  Lymphadenopathy:     Cervical: No cervical adenopathy.  Skin:    General: Skin is warm and dry.  Neurological:     Mental Status: He is alert and oriented to person, place, and time.     No results found for any visits on 11/11/22.      Assessment & Plan:   Problem List Items Addressed This Visit   None Visit Diagnoses     Acute non-recurrent sinusitis, unspecified  location    -  Primary   Relevant Medications   HYDROcodone bit-homatropine (HYCODAN) 5-1.5 MG/5ML syrup   azithromycin (ZITHROMAX) 250 MG tablet       Acute sinusitis  -we will treat with azithromycin.  We did discuss the possibility that this still could be viral.  But since has been going on for a week and she feels worse the last 2 days we will go ahead and treat.  Hydrocodone cough syrup provided for bedtime only.  If she is not better in 1 week then please let us know.  Meds ordered this encounter  Medications   HYDROcodone bit-homatropine (HYCODAN) 5-1.5 MG/5ML syrup    Sig: Take 5 mLs by mouth every 8 (eight) hours as needed for cough.    Dispense:  75 mL    Refill:  0   azithromycin (ZITHROMAX) 250 MG tablet    Sig: 2 Ttabs PO on Day 1, then one a day x 4 days.    Dispense:  6 tablet    Refill:  0    No follow-ups on file.  Beatrice Lecher, MD

## 2022-12-02 ENCOUNTER — Ambulatory Visit: Payer: Managed Care, Other (non HMO) | Admitting: Family Medicine

## 2023-01-12 LAB — HM MAMMOGRAPHY

## 2023-01-16 ENCOUNTER — Encounter: Payer: Self-pay | Admitting: Family Medicine

## 2023-02-16 ENCOUNTER — Telehealth: Payer: Self-pay | Admitting: General Practice

## 2023-02-16 NOTE — Transitions of Care (Post Inpatient/ED Visit) (Addendum)
   02/16/2023  Name: Breanna Flynn MRN: [id number] DOB: 10/20/1970  Today's TOC FU Call Status: Today's TOC FU Call Status:: Unsuccessul Call (1st Attempt) Unsuccessful Call (1st Attempt) Date: 02/16/23  Attempted to reach the patient regarding the most recent Inpatient/ED visit.  Follow Up Plan: Additional outreach attempts will be made to reach the patient to complete the Transitions of Care (Post Inpatient/ED visit) call.   Signature [place] Charon, RN BSN

## 2023-02-17 NOTE — Transitions of Care (Post Inpatient/ED Visit) (Signed)
   02/17/2023  Name: Breanna Flynn MRN: [id number] DOB: 1971/03/20  Today's TOC FU Call Status: Today's TOC FU Call Status:: Unsuccessful Call (2nd Attempt) Unsuccessful Call (1st Attempt) Date: 02/16/23 Unsuccessful Call (2nd Attempt) Date: 02/17/23  Attempted to reach the patient regarding the most recent Inpatient/ED visit.  Follow Up Plan: Additional outreach attempts will be made to reach the patient to complete the Transitions of Care (Post Inpatient/ED visit) call.   Signature [place] Charon, RN BSN

## 2023-02-18 ENCOUNTER — Encounter: Payer: Self-pay | Admitting: Family Medicine

## 2023-02-18 ENCOUNTER — Ambulatory Visit: Payer: Managed Care, Other (non HMO) | Admitting: Family Medicine

## 2023-02-18 VITALS — BP 104/58 | HR 82 | Ht 67.0 in | Wt 153.0 lb

## 2023-02-18 DIAGNOSIS — I83813 Varicose veins of bilateral lower extremities with pain: Secondary | ICD-10-CM | POA: Diagnosis not present

## 2023-02-18 DIAGNOSIS — R252 Cramp and spasm: Secondary | ICD-10-CM | POA: Diagnosis not present

## 2023-02-18 DIAGNOSIS — Z1211 Encounter for screening for malignant neoplasm of colon: Secondary | ICD-10-CM

## 2023-02-18 DIAGNOSIS — N951 Menopausal and female climacteric states: Secondary | ICD-10-CM | POA: Diagnosis not present

## 2023-02-18 NOTE — Progress Notes (Signed)
Pt would like to discuss her labs from her recent ED visit from [facility]. She feels that this may be menopause related? She would also like for you to look at her veins in her legs

## 2023-02-18 NOTE — Assessment & Plan Note (Signed)
Will place referral for consult. She does wear compression stockings.

## 2023-02-18 NOTE — Progress Notes (Addendum)
Established Patient Office Visit  Subjective   Patient ID: Breanna Flynn, adult    DOB: [birth year]  Age: 52 y.o. MRN: [id number]  Chief Complaint  Patient presents with   Follow-up    HPI  F/U ED visit from 5//23.  She was doing laundry and suddenly had a sharp pain in her right leg.  She says that was not too unusual because occasionally she will have like a blood vessel burst.  She has a history of varicose veins but suddenly she started to have a sharp pain in her left arm.  She was holding some laundry and dropped it when she had the pain in her leg.  Eventually the right leg pain eased off but the left arm pain continued so at that point she actually ended up going to the emergency room.  They did do some labs and everything checked out.  Her blood pressure was high when she first got there but it trended downward before she left.  She normally has low blood pressure.  She did notice a couple of bruises on that left arm 1 on the upper arm and 1 on the lower lower arm that have shown up in the last couple of days.  She also reports a history of frequent cramping in her legs.  She also went to discuss her varicose veins today.  She did do a consult with the [facility] last year and they were unable to get it approved with the insurance even though she has had some treatments in the past.  She does get discomfort in her legs heaviness and pain at times.  She also wanted to discuss being in perimenopause.  Is been noticing a few symptoms here and there.  Nothing severe at this point.  Her menses has become a little bit more regular in fact her period started right after this episode after she had not had a period for probably about 6 weeks.     ROS    Objective:     BP (!) 104/58   Pulse 82   Ht 5\' 7"  (1.702 m)   Wt 153 lb (69.4 kg)   SpO2 98%   BMI 23.96 kg/m    Physical Exam Vitals reviewed.  Constitutional:      Appearance: He is well-developed.  HENT:      Head: Normocephalic and atraumatic.  Eyes:     Conjunctiva/sclera: Conjunctivae normal.  Cardiovascular:     Rate and Rhythm: Normal rate.  Pulmonary:     Effort: Pulmonary effort is normal.  Skin:    General: Skin is dry.     Coloration: Skin is not pale.  Neurological:     Mental Status: He is alert and oriented to person, place, and time.  Psychiatric:        Behavior: Behavior normal.      No results found for any visits on 02/18/23.    The ASCVD Risk score (Arnett DK, et al., 2019) failed to calculate for the following reasons:   The valid total cholesterol range is 130 to 320 mg/dL    Assessment & Plan:   Problem List Items Addressed This Visit       Cardiovascular and Mediastinum   Varicose veins of both lower extremities with pain    Will place referral for consult. She does wear compression stockings.        Relevant Orders   Ambulatory referral to Vascular Surgery   Other Visit Diagnoses  Screening for malignant neoplasm of colon    -  Primary   Relevant Orders   Cologuard   Leg cramping       Perimenopausal           Will place order for Cologuard.  Leg cramping-she already does a topical magnesium and has for years.  We discussed maybe a trial of B6 as there is a little data with that.  Continue to hydrate well, gentle stretches, wearing good supportive shoes.  If it becomes worse or more frequent then we can consider prescription medication for treatment if needed.  Recent labs were unrevealing for potential cause for the cramping.  Also still do not have a good cause for the sharp pains that she experienced unfortunately and everything checked out normal which is great.  No follow-ups on file.    Nani Gasser, MD

## 2023-02-18 NOTE — Transitions of Care (Post Inpatient/ED Visit) (Signed)
   02/18/2023  Name: Breanna Flynn MRN: [id number] DOB: July 24, 1971  Today's TOC FU Call Status: Today's TOC FU Call Status:: Unsuccessful Call (3rd Attempt) Unsuccessful Call (1st Attempt) Date: 02/16/23 Unsuccessful Call (2nd Attempt) Date: 02/17/23 Unsuccessful Call (3rd Attempt) Date: 02/18/23  Attempted to reach the patient regarding the most recent Inpatient/ED visit.  Follow Up Plan: No further outreach attempts will be made at this time. We have been unable to contact the patient.  Signature Modesto Charon, RN BSN

## 2023-03-01 ENCOUNTER — Encounter: Payer: Self-pay | Admitting: Family Medicine

## 2023-03-31 LAB — COLOGUARD: COLOGUARD: POSITIVE — AB

## 2023-04-01 ENCOUNTER — Encounter: Payer: Self-pay | Admitting: Family Medicine

## 2023-04-01 DIAGNOSIS — Z1211 Encounter for screening for malignant neoplasm of colon: Secondary | ICD-10-CM

## 2023-04-01 NOTE — Progress Notes (Signed)
Hi Breanna Flynn, your cologuard was positive.  This means that you are at higher risk for colon cancer based on the test results.  It does not diagnose colon cancer.  So the neck step is for you to have a full colonoscopy.  I need to make a referral for you so that they can take a closer look.  Do have a preference for provider or location?

## 2023-04-03 ENCOUNTER — Other Ambulatory Visit: Payer: Self-pay

## 2023-04-03 DIAGNOSIS — Z1211 Encounter for screening for malignant neoplasm of colon: Secondary | ICD-10-CM

## 2024-01-25 ENCOUNTER — Other Ambulatory Visit: Payer: Self-pay | Admitting: Family Medicine

## 2024-01-25 DIAGNOSIS — B009 Herpesviral infection, unspecified: Secondary | ICD-10-CM

## 2024-01-25 NOTE — Telephone Encounter (Signed)
 Copied from CRM [id number]. Topic: Clinical - Medication Refill >> Jan 25, 2024  9:16 AM Hamdi H wrote: Most Recent Primary Care Visit:  Provider: METHENEY, CATHERINE D  Department: [facility]  Visit Type: OFFICE VISIT  Date: 02/18/2023  Medication: acyclovir  (ZOVIRAX ) 400 MG tablet  Has the patient contacted their pharmacy? Yes (Agent: If no, request that the patient contact the pharmacy for the refill. If patient does not wish to contact the pharmacy document the reason why and proceed with request.) (Agent: If yes, when and what did the pharmacy advise?) Doesn't have an active prescription.   Is this the correct pharmacy for this prescription? Yes If no, delete pharmacy and type the correct one.  This is the patient's preferred pharmacy:  [facility] [phone] - [place], [place] - [address]  [place] [address] Phone: [phone] Fax: [phone]   Has the prescription been filled recently? No  Is the patient out of the medication? No  Has the patient been seen for an appointment in the last year OR does the patient have an upcoming appointment? Yes  Can we respond through MyChart? No, call instead   Agent: Please be advised that Rx refills may take up to 3 business days. We ask that you follow-up with your pharmacy.

## 2024-01-26 ENCOUNTER — Ambulatory Visit: Payer: Self-pay | Admitting: Family Medicine

## 2024-01-26 MED ORDER — ACYCLOVIR 400 MG PO TABS
400.0000 mg | ORAL_TABLET | Freq: Three times a day (TID) | ORAL | 11 refills | Status: AC
Start: 2024-01-26 — End: ?

## 2024-01-26 NOTE — Progress Notes (Deleted)
   Acute Office Visit  Subjective:     Patient ID: Breanna Flynn, adult    DOB: 1971/07/24, 53 y.o.   MRN: [id number]  No chief complaint on file.   HPI Patient is in today for ***  ROS      Objective:    There were no vitals taken for this visit. {Vitals History (Optional):23777}  Physical Exam  No results found for any visits on 01/26/24.      Assessment & Plan:   Problem List Items Addressed This Visit   None   No orders of the defined types were placed in this encounter.   No follow-ups on file.  Duaine German, MD

## 2024-04-05 ENCOUNTER — Encounter: Payer: Self-pay | Admitting: Family Medicine

## 2024-04-05 DIAGNOSIS — Z0000 Encounter for general adult medical examination without abnormal findings: Secondary | ICD-10-CM

## 2024-04-06 NOTE — Telephone Encounter (Signed)
 Orders Placed This Encounter  Procedures   Lipid Profile   CBC w/Diff/Platelet   CMP14+EGFR

## 2024-04-07 NOTE — Telephone Encounter (Signed)
 Last read by Olam JONETTA Hurst at 7:24PM on 04/06/2024.

## 2024-04-08 ENCOUNTER — Ambulatory Visit: Payer: Self-pay | Admitting: Family Medicine

## 2024-04-08 LAB — CMP14+EGFR
ALT: 11 IU/L (ref 0–32)
AST: 16 IU/L (ref 0–40)
Albumin: 4.4 g/dL (ref 3.8–4.9)
Alkaline Phosphatase: 69 IU/L (ref 44–121)
BUN/Creatinine Ratio: 18 (ref 9–23)
BUN: 15 mg/dL (ref 6–24)
Bilirubin Total: 0.4 mg/dL (ref 0.0–1.2)
CO2: 22 mmol/L (ref 20–29)
Calcium: 8.9 mg/dL (ref 8.7–10.2)
Chloride: 101 mmol/L (ref 96–106)
Creatinine, Ser: 0.82 mg/dL (ref 0.57–1.00)
Globulin, Total: 2.5 g/dL (ref 1.5–4.5)
Glucose: 86 mg/dL (ref 70–99)
Potassium: 4.4 mmol/L (ref 3.5–5.2)
Sodium: 138 mmol/L (ref 134–144)
Total Protein: 6.9 g/dL (ref 6.0–8.5)
eGFR: 86 mL/min/1.73 (ref >59)

## 2024-04-08 LAB — CBC WITH DIFFERENTIAL/PLATELET
Basophils Absolute: 0 x10E3/uL (ref 0.0–0.2)
Basos: 1 %
EOS (ABSOLUTE): 0.2 x10E3/uL (ref 0.0–0.4)
Eos: 3 %
Hematocrit: 41.8 % (ref 34.0–46.6)
Hemoglobin: 13.5 g/dL (ref 11.1–15.9)
Immature Grans (Abs): 0 x10E3/uL (ref 0.0–0.1)
Immature Granulocytes: 0 %
Lymphocytes Absolute: 1.8 x10E3/uL (ref 0.7–3.1)
Lymphs: 33 %
MCH: 31.3 pg (ref 26.6–33.0)
MCHC: 32.3 g/dL (ref 31.5–35.7)
MCV: 97 fL (ref 79–97)
Monocytes Absolute: 0.5 x10E3/uL (ref 0.1–0.9)
Monocytes: 9 %
Neutrophils Absolute: 3 x10E3/uL (ref 1.4–7.0)
Neutrophils: 54 %
Platelets: 231 x10E3/uL (ref 150–450)
RBC: 4.31 x10E6/uL (ref 3.77–5.28)
RDW: 12 % (ref 11.7–15.4)
WBC: 5.4 x10E3/uL (ref 3.4–10.8)

## 2024-04-08 LAB — LIPID PANEL
Chol/HDL Ratio: 1.9 ratio (ref 0.0–4.4)
Cholesterol, Total: 142 mg/dL (ref 100–199)
HDL: 76 mg/dL (ref >39)
LDL Chol Calc (NIH): 56 mg/dL (ref 0–99)
Triglycerides: 40 mg/dL (ref 0–149)
VLDL Cholesterol Cal: 10 mg/dL (ref 5–40)

## 2024-04-08 NOTE — Progress Notes (Signed)
 Your lab work is within acceptable range and there are no concerning findings.   ?

## 2024-04-11 ENCOUNTER — Ambulatory Visit (INDEPENDENT_AMBULATORY_CARE_PROVIDER_SITE_OTHER): Admitting: Family Medicine

## 2024-04-11 VITALS — BP 112/56 | HR 65 | Ht 67.0 in | Wt 153.0 lb

## 2024-04-11 DIAGNOSIS — Z1231 Encounter for screening mammogram for malignant neoplasm of breast: Secondary | ICD-10-CM | POA: Diagnosis not present

## 2024-04-11 DIAGNOSIS — B001 Herpesviral vesicular dermatitis: Secondary | ICD-10-CM | POA: Diagnosis not present

## 2024-04-11 DIAGNOSIS — Z0000 Encounter for general adult medical examination without abnormal findings: Secondary | ICD-10-CM

## 2024-04-11 MED ORDER — XERESE 5-1 % EX CREA: 1.0000 | TOPICAL_CREAM | Freq: Every day | CUTANEOUS | 3 refills | Status: AC

## 2024-04-11 NOTE — Progress Notes (Signed)
 Complete physical exam  Patient: Breanna Flynn   DOB: 30-May-1971   53 y.o. Adult  MRN: [id number]  Subjective:    Chief Complaint  Patient presents with   Annual Exam    Breanna Flynn is a 53 y.o. adult who presents today for a complete physical exam. He reports consuming a general diet. Exercises.   She generally feels well.  She does not have additional problems to discuss today.   She has a prescription cream that she uses for cold sores that she would like a new prescription for if possible.   Most recent fall risk assessment:    04/11/2024   11:16 AM  Fall Risk   Falls in the past year? 0  Number falls in past yr: 0  Injury with Fall? 0  Risk for fall due to : No Fall Risks  Follow up Falls evaluation completed     Most recent depression screenings:    04/11/2024   11:17 AM 02/18/2023   10:29 AM  PHQ 2/9 Scores  PHQ - 2 Score 0 0         Patient Care Team: Alvan Dorothyann JONETTA, MD as PCP - General   Outpatient Medications Prior to Visit  Medication Sig   acyclovir  (ZOVIRAX ) 400 MG tablet Take 1 tablet (400 mg total) by mouth 3 (three) times daily. X 5 day prn outbreak   No facility-administered medications prior to visit.    ROS        Objective:     BP (!) 112/56   Pulse 65   Ht 5' 7 (1.702 m)   Wt 153 lb 0.6 oz (69.4 kg)   SpO2 100%   BMI 23.97 kg/m     Physical Exam Vitals and nursing note reviewed. Exam conducted with a chaperone present.  Constitutional:      Appearance: Normal appearance.  HENT:     Head: Normocephalic and atraumatic.  Eyes:     Conjunctiva/sclera: Conjunctivae normal.  Cardiovascular:     Rate and Rhythm: Normal rate and regular rhythm.  Pulmonary:     Effort: Pulmonary effort is normal.     Breath sounds: Normal breath sounds.  Chest:     Chest wall: No mass.  Breasts:    Right: Normal. No mass, nipple discharge or skin change.     Left: Normal. No mass, nipple discharge or skin change.  Lymphadenopathy:      Upper Body:     Right upper body: No supraclavicular, axillary or pectoral adenopathy.     Left upper body: No supraclavicular, axillary or pectoral adenopathy.  Skin:    General: Skin is warm and dry.  Neurological:     Mental Status: He is alert.  Psychiatric:        Mood and Affect: Mood normal.      No results found for any visits on 04/11/24.      Assessment & Plan:    Routine Health Maintenance and Physical Exam  Immunization History  Administered Date(s) Administered   PPD Test 12/09/2021    Health Maintenance  Topic Date Due   HIV Screening  Never done   Hepatitis C Screening  Never done   Hepatitis B Vaccines (1 of 3 - 19+ 3-dose series) Never done   Colonoscopy  Never done   Zoster Vaccines- Shingrix (1 of 2) Never done   COVID-19 Vaccine (1 - 2024-25 season) Never done   MAMMOGRAM  01/12/2024   INFLUENZA VACCINE  04/29/2024   Cervical Cancer Screening (HPV/Pap Cotest)  07/16/2026   HPV VACCINES  Aged Out   Meningococcal B Vaccine  Aged Out   DTaP/Tdap/Td  Discontinued    Discussed health benefits of physical activity, and encouraged him to engage in regular exercise appropriate for his age and condition.  Problem List Items Addressed This Visit       Digestive   Cold sore   Relevant Medications   Acyclovir -Hydrocortisone (XERESE ) 5-1 % CREA   Other Visit Diagnoses       Wellness examination    -  Primary     Screening mammogram for breast cancer       Relevant Orders   MM 3D SCREENING MAMMOGRAM BILATERAL BREAST      There for physical doing well.  We did go over her labs together everything looked great and reassuring.  She did have an abnormal Cologuard previously so really encouraged her to consider to have a colonoscopy.  Just to let me know when she is ready to do it and I will be happy to make a referral she is a little bit concerned about potential prep.  Did go ahead and order mammogram for her to get that updated.  Return in about 1  year (around 04/11/2025) for Wellness Exam.     Dorothyann Byars, MD

## 2024-07-04 ENCOUNTER — Encounter: Payer: Self-pay | Admitting: Family Medicine

## 2025-04-12 ENCOUNTER — Encounter: Admitting: Family Medicine
